# Patient Record
Sex: Female | Born: 1965 | Race: White | Hispanic: No | Marital: Single | Smoking: Current every day smoker
Health system: Southern US, Community
[De-identification: ages and names within clinical notes are randomized; demographics above are authoritative.]

## PROBLEM LIST (undated history)

## (undated) DIAGNOSIS — N393 Stress incontinence (female) (male): Secondary | ICD-10-CM

## (undated) DIAGNOSIS — R079 Chest pain, unspecified: Secondary | ICD-10-CM

## (undated) DIAGNOSIS — I219 Acute myocardial infarction, unspecified: Secondary | ICD-10-CM

## (undated) DIAGNOSIS — I252 Old myocardial infarction: Secondary | ICD-10-CM

## (undated) DIAGNOSIS — I10 Essential (primary) hypertension: Secondary | ICD-10-CM

## (undated) DIAGNOSIS — E785 Hyperlipidemia, unspecified: Secondary | ICD-10-CM

## (undated) DIAGNOSIS — I2510 Atherosclerotic heart disease of native coronary artery without angina pectoris: Secondary | ICD-10-CM

## (undated) DIAGNOSIS — K219 Gastro-esophageal reflux disease without esophagitis: Secondary | ICD-10-CM

## (undated) DIAGNOSIS — Z955 Presence of coronary angioplasty implant and graft: Secondary | ICD-10-CM

## (undated) DIAGNOSIS — G8929 Other chronic pain: Secondary | ICD-10-CM

## (undated) DIAGNOSIS — G4730 Sleep apnea, unspecified: Secondary | ICD-10-CM

## (undated) DIAGNOSIS — Z951 Presence of aortocoronary bypass graft: Secondary | ICD-10-CM

## (undated) DIAGNOSIS — F319 Bipolar disorder, unspecified: Secondary | ICD-10-CM

## (undated) DIAGNOSIS — Z8249 Family history of ischemic heart disease and other diseases of the circulatory system: Secondary | ICD-10-CM

## (undated) HISTORY — PX: CORONARY ARTERY BYPASS GRAFT: SHX141

## (undated) HISTORY — PX: CARDIOVASCULAR STRESS TEST: SHX262

## (undated) HISTORY — DX: Essential (primary) hypertension: I10

## (undated) HISTORY — DX: Acute myocardial infarction, unspecified: I21.9

## (undated) HISTORY — PX: CORONARY ANGIOPLASTY WITH STENT PLACEMENT: SHX49

## (undated) HISTORY — PX: CARDIAC CATHETERIZATION: SHX172

---

## 1999-07-13 ENCOUNTER — Encounter (INDEPENDENT_AMBULATORY_CARE_PROVIDER_SITE_OTHER): Payer: Self-pay

## 1999-07-13 ENCOUNTER — Ambulatory Visit (HOSPITAL_COMMUNITY): Admission: RE | Admit: 1999-07-13 | Discharge: 1999-07-13 | Payer: Self-pay | Admitting: *Deleted

## 1999-07-13 ENCOUNTER — Other Ambulatory Visit: Admission: RE | Admit: 1999-07-13 | Discharge: 1999-07-13 | Payer: Self-pay | Admitting: *Deleted

## 1999-07-13 ENCOUNTER — Encounter: Payer: Self-pay | Admitting: *Deleted

## 2000-04-06 ENCOUNTER — Ambulatory Visit (HOSPITAL_COMMUNITY): Admission: RE | Admit: 2000-04-06 | Discharge: 2000-04-06 | Payer: Self-pay | Admitting: Gastroenterology

## 2001-05-22 ENCOUNTER — Encounter: Payer: Self-pay | Admitting: Family Medicine

## 2001-05-22 ENCOUNTER — Ambulatory Visit (HOSPITAL_COMMUNITY): Admission: RE | Admit: 2001-05-22 | Discharge: 2001-05-22 | Payer: Self-pay | Admitting: Family Medicine

## 2002-01-16 ENCOUNTER — Encounter: Payer: Self-pay | Admitting: *Deleted

## 2002-01-16 ENCOUNTER — Emergency Department (HOSPITAL_COMMUNITY): Admission: EM | Admit: 2002-01-16 | Discharge: 2002-01-16 | Payer: Self-pay | Admitting: Emergency Medicine

## 2002-02-19 ENCOUNTER — Encounter: Payer: Self-pay | Admitting: Gastroenterology

## 2002-02-19 ENCOUNTER — Ambulatory Visit (HOSPITAL_COMMUNITY): Admission: RE | Admit: 2002-02-19 | Discharge: 2002-02-19 | Payer: Self-pay | Admitting: Gastroenterology

## 2002-03-28 ENCOUNTER — Encounter: Payer: Self-pay | Admitting: Surgery

## 2002-04-04 ENCOUNTER — Encounter (INDEPENDENT_AMBULATORY_CARE_PROVIDER_SITE_OTHER): Payer: Self-pay | Admitting: Specialist

## 2002-04-04 ENCOUNTER — Observation Stay (HOSPITAL_COMMUNITY): Admission: RE | Admit: 2002-04-04 | Discharge: 2002-04-05 | Payer: Self-pay | Admitting: Surgery

## 2002-04-04 ENCOUNTER — Encounter: Payer: Self-pay | Admitting: Surgery

## 2002-04-04 HISTORY — PX: LAPAROSCOPIC CHOLECYSTECTOMY: SUR755

## 2002-08-12 ENCOUNTER — Encounter (INDEPENDENT_AMBULATORY_CARE_PROVIDER_SITE_OTHER): Payer: Self-pay | Admitting: *Deleted

## 2002-08-12 ENCOUNTER — Ambulatory Visit (HOSPITAL_COMMUNITY): Admission: RE | Admit: 2002-08-12 | Discharge: 2002-08-12 | Payer: Self-pay | Admitting: Gastroenterology

## 2002-09-06 ENCOUNTER — Encounter: Payer: Self-pay | Admitting: Thoracic Surgery (Cardiothoracic Vascular Surgery)

## 2002-09-10 ENCOUNTER — Inpatient Hospital Stay (HOSPITAL_COMMUNITY)
Admission: RE | Admit: 2002-09-10 | Discharge: 2002-09-16 | Payer: Self-pay | Admitting: Thoracic Surgery (Cardiothoracic Vascular Surgery)

## 2002-09-10 ENCOUNTER — Encounter: Payer: Self-pay | Admitting: Thoracic Surgery (Cardiothoracic Vascular Surgery)

## 2002-09-11 ENCOUNTER — Encounter: Payer: Self-pay | Admitting: Thoracic Surgery (Cardiothoracic Vascular Surgery)

## 2002-09-12 ENCOUNTER — Encounter: Payer: Self-pay | Admitting: Thoracic Surgery (Cardiothoracic Vascular Surgery)

## 2002-09-13 ENCOUNTER — Encounter: Payer: Self-pay | Admitting: Thoracic Surgery (Cardiothoracic Vascular Surgery)

## 2002-10-14 ENCOUNTER — Encounter (HOSPITAL_COMMUNITY): Admission: RE | Admit: 2002-10-14 | Discharge: 2003-01-12 | Payer: Self-pay | Admitting: Cardiology

## 2002-10-21 ENCOUNTER — Encounter: Payer: Self-pay | Admitting: Thoracic Surgery (Cardiothoracic Vascular Surgery)

## 2002-10-21 ENCOUNTER — Encounter
Admission: RE | Admit: 2002-10-21 | Discharge: 2002-10-21 | Payer: Self-pay | Admitting: Thoracic Surgery (Cardiothoracic Vascular Surgery)

## 2003-01-13 ENCOUNTER — Encounter (HOSPITAL_COMMUNITY): Admission: RE | Admit: 2003-01-13 | Discharge: 2003-01-31 | Payer: Self-pay | Admitting: Cardiology

## 2003-08-14 ENCOUNTER — Other Ambulatory Visit: Admission: RE | Admit: 2003-08-14 | Discharge: 2003-08-14 | Payer: Self-pay | Admitting: Obstetrics and Gynecology

## 2003-10-07 ENCOUNTER — Encounter (INDEPENDENT_AMBULATORY_CARE_PROVIDER_SITE_OTHER): Payer: Self-pay | Admitting: *Deleted

## 2003-10-07 ENCOUNTER — Observation Stay (HOSPITAL_COMMUNITY): Admission: RE | Admit: 2003-10-07 | Discharge: 2003-10-08 | Payer: Self-pay | Admitting: Obstetrics and Gynecology

## 2003-10-07 HISTORY — PX: VAGINAL HYSTERECTOMY: SUR661

## 2003-12-24 ENCOUNTER — Inpatient Hospital Stay: Payer: Self-pay | Admitting: Cardiology

## 2003-12-24 ENCOUNTER — Other Ambulatory Visit: Payer: Self-pay

## 2004-04-30 ENCOUNTER — Ambulatory Visit (HOSPITAL_COMMUNITY): Admission: RE | Admit: 2004-04-30 | Discharge: 2004-04-30 | Payer: Self-pay | Admitting: Emergency Medicine

## 2005-05-11 ENCOUNTER — Ambulatory Visit (HOSPITAL_COMMUNITY): Admission: RE | Admit: 2005-05-11 | Discharge: 2005-05-11 | Payer: Self-pay | Admitting: Emergency Medicine

## 2005-09-14 ENCOUNTER — Emergency Department (HOSPITAL_COMMUNITY): Admission: EM | Admit: 2005-09-14 | Discharge: 2005-09-14 | Payer: Self-pay | Admitting: Emergency Medicine

## 2005-09-20 ENCOUNTER — Emergency Department (HOSPITAL_COMMUNITY): Admission: EM | Admit: 2005-09-20 | Discharge: 2005-09-20 | Payer: Self-pay | Admitting: Family Medicine

## 2006-08-31 ENCOUNTER — Ambulatory Visit: Payer: Self-pay | Admitting: Internal Medicine

## 2006-08-31 ENCOUNTER — Observation Stay (HOSPITAL_COMMUNITY): Admission: EM | Admit: 2006-08-31 | Discharge: 2006-09-01 | Payer: Self-pay | Admitting: Emergency Medicine

## 2006-11-07 ENCOUNTER — Ambulatory Visit (HOSPITAL_COMMUNITY): Admission: RE | Admit: 2006-11-07 | Discharge: 2006-11-07 | Payer: Self-pay | Admitting: Obstetrics and Gynecology

## 2007-02-05 ENCOUNTER — Encounter (INDEPENDENT_AMBULATORY_CARE_PROVIDER_SITE_OTHER): Payer: Self-pay | Admitting: Surgery

## 2007-02-05 ENCOUNTER — Ambulatory Visit (HOSPITAL_BASED_OUTPATIENT_CLINIC_OR_DEPARTMENT_OTHER): Admission: RE | Admit: 2007-02-05 | Discharge: 2007-02-05 | Payer: Self-pay | Admitting: Surgery

## 2007-02-05 HISTORY — PX: OTHER SURGICAL HISTORY: SHX169

## 2008-03-15 ENCOUNTER — Emergency Department (HOSPITAL_COMMUNITY): Admission: EM | Admit: 2008-03-15 | Discharge: 2008-03-15 | Payer: Self-pay | Admitting: Emergency Medicine

## 2008-03-19 ENCOUNTER — Ambulatory Visit: Payer: Self-pay | Admitting: Cardiology

## 2008-03-26 ENCOUNTER — Emergency Department (HOSPITAL_COMMUNITY): Admission: EM | Admit: 2008-03-26 | Discharge: 2008-03-26 | Payer: Self-pay | Admitting: Family Medicine

## 2008-06-26 IMAGING — CR DG CHEST 1V PORT
1 series · 1 of 1 positions shown · non-contrast
Comparison: none

HISTORY: Chest pain

PORTABLE CHEST ONE VIEW:
Normal heart size status post CABG.
Normal mediastinal contours and pulmonary vascularity.
Minimal bronchitic changes and bibasilar atelectasis.
Lungs otherwise clear.
Bones unremarkable.
No pneumothorax.

[view not recorded]
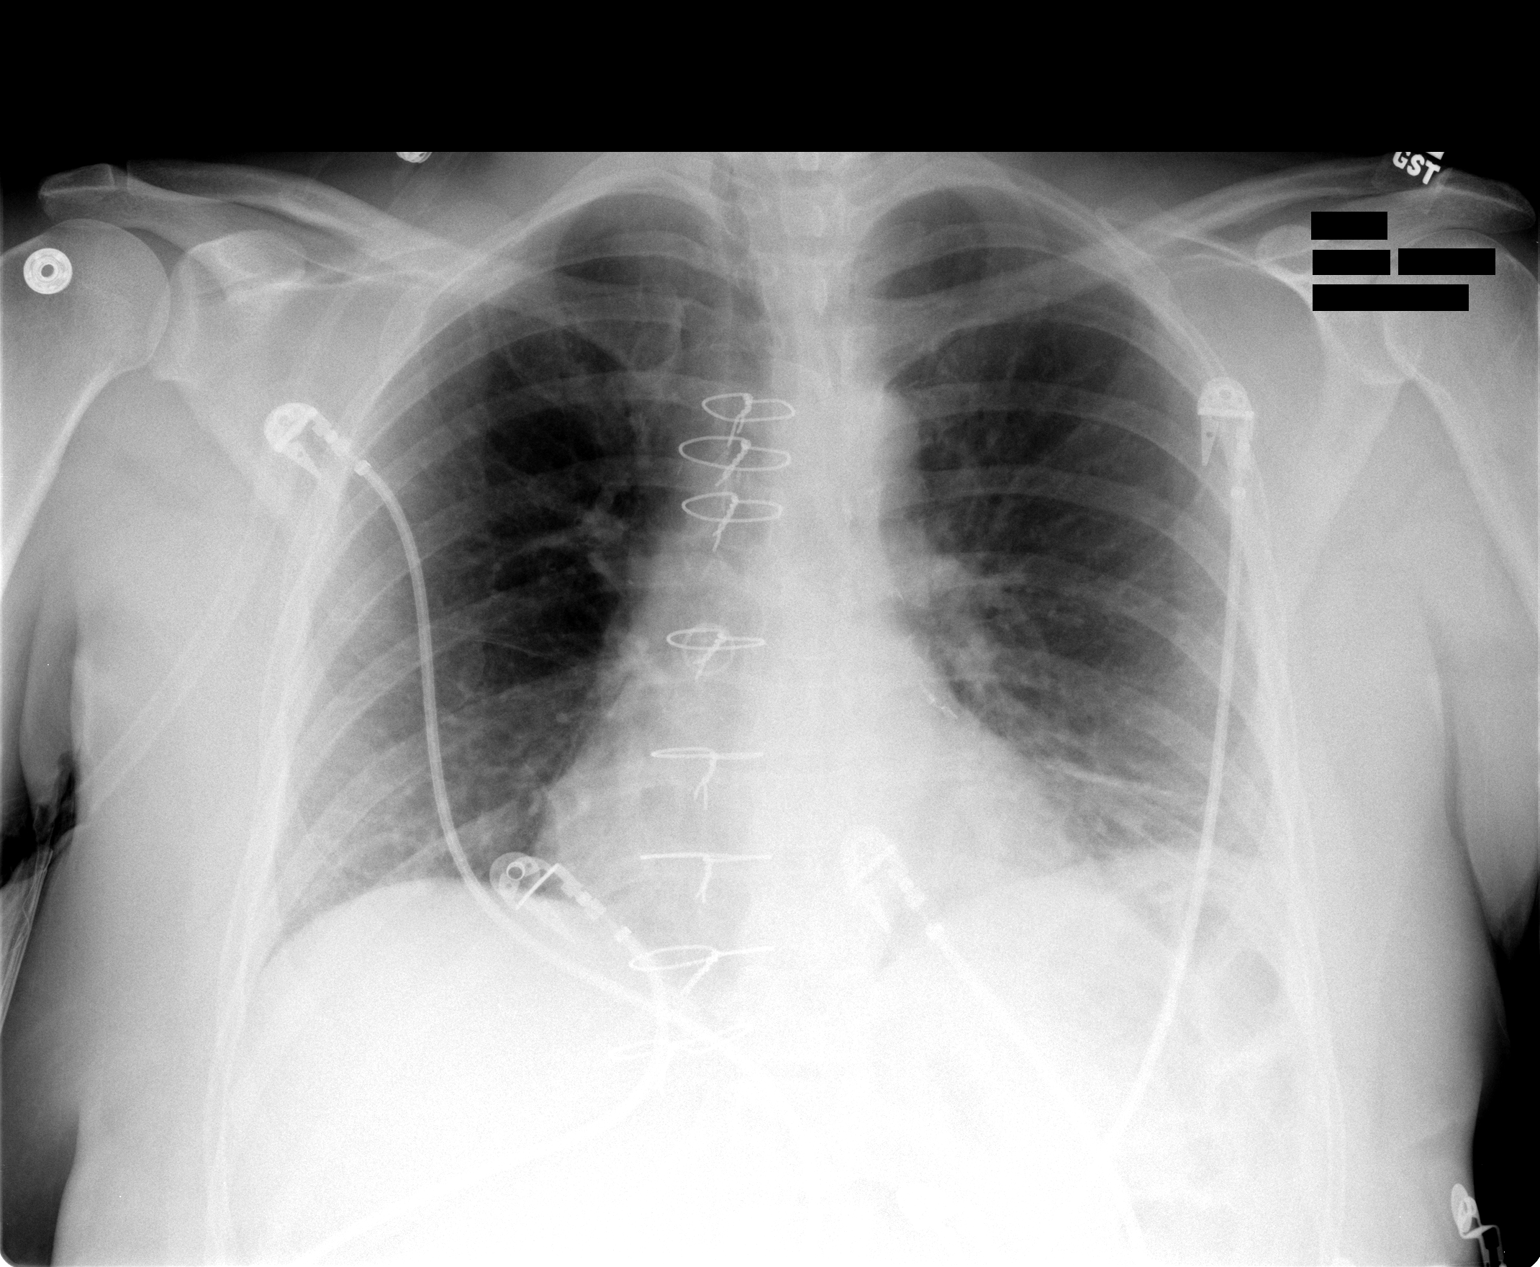

[1 of 1 positions shown; findings below may reference images not displayed]

IMPRESSION: Minimal bronchitic changes and bibasilar atelectasis.
Status post CABG.

## 2008-11-13 ENCOUNTER — Emergency Department (HOSPITAL_COMMUNITY): Admission: EM | Admit: 2008-11-13 | Discharge: 2008-11-13 | Payer: Self-pay | Admitting: Emergency Medicine

## 2008-12-23 ENCOUNTER — Emergency Department (HOSPITAL_COMMUNITY): Admission: EM | Admit: 2008-12-23 | Discharge: 2008-12-23 | Payer: Self-pay | Admitting: Emergency Medicine

## 2009-06-25 ENCOUNTER — Ambulatory Visit (HOSPITAL_BASED_OUTPATIENT_CLINIC_OR_DEPARTMENT_OTHER): Admission: RE | Admit: 2009-06-25 | Discharge: 2009-06-25 | Payer: Self-pay | Admitting: Urology

## 2009-06-25 HISTORY — PX: OTHER SURGICAL HISTORY: SHX169

## 2009-11-19 ENCOUNTER — Ambulatory Visit: Payer: Self-pay | Admitting: Cardiology

## 2010-04-29 ENCOUNTER — Encounter (HOSPITAL_COMMUNITY)
Admission: RE | Admit: 2010-04-29 | Discharge: 2010-04-29 | Disposition: A | Discharge status: Home / Self Care | Payer: Managed Care, Other (non HMO) | Source: Ambulatory Visit | Attending: Obstetrics and Gynecology | Admitting: Obstetrics and Gynecology

## 2010-04-29 DIAGNOSIS — Z01810 Encounter for preprocedural cardiovascular examination: Secondary | ICD-10-CM | POA: Insufficient documentation

## 2010-04-29 DIAGNOSIS — Z01812 Encounter for preprocedural laboratory examination: Secondary | ICD-10-CM | POA: Insufficient documentation

## 2010-04-29 LAB — BASIC METABOLIC PANEL
BUN: 11 mg/dL (ref 6–23)
CO2: 24 mEq/L (ref 19–32)
Calcium: 9.1 mg/dL (ref 8.4–10.5)
Chloride: 106 mEq/L (ref 96–112)
Creatinine, Ser: 0.89 mg/dL (ref 0.4–1.2)

## 2010-04-29 LAB — CBC
Hemoglobin: 15.2 g/dL — ABNORMAL HIGH (ref 12.0–15.0)
MCH: 31.3 pg (ref 26.0–34.0)
MCHC: 34.7 g/dL (ref 30.0–36.0)
MCV: 90.1 fL (ref 78.0–100.0)
RBC: 4.86 MIL/uL (ref 3.87–5.11)

## 2010-05-06 ENCOUNTER — Other Ambulatory Visit: Payer: Self-pay | Admitting: Obstetrics and Gynecology

## 2010-05-06 ENCOUNTER — Ambulatory Visit (HOSPITAL_COMMUNITY)
Admission: RE | Admit: 2010-05-06 | Discharge: 2010-05-06 | Disposition: A | Discharge status: Home / Self Care | Payer: Managed Care, Other (non HMO) | Source: Ambulatory Visit | Attending: Obstetrics and Gynecology | Admitting: Obstetrics and Gynecology

## 2010-05-06 DIAGNOSIS — A630 Anogenital (venereal) warts: Secondary | ICD-10-CM | POA: Insufficient documentation

## 2010-05-06 HISTORY — PX: OTHER SURGICAL HISTORY: SHX169

## 2010-05-11 NOTE — H&P (Addendum)
  [name], [name]            ACCOUNT NO.:  [id number]  MEDICAL RECORD NO.:  [id number]           PATIENT TYPE:  O  LOCATION:  SDC                           F[facility]ILITY:  [facility]  PHYSICIAN:  Janine Limbo, M.D.DATE OF BIRTH:  [birth year]  DATE OF ADMISSION:  04/29/2010 DATE OF DISCHARGE:  04/29/2010                             HISTORY & PHYSICAL   HISTORY OF PRESENT ILLNESS:  [name] is a 45 year old female, gravida 0, who presents for laser ablation of condylomata.  The patient has been followed at the [facility].  The patient notes the onset of multiple vulvar, rectal, and vaginal condylomata.  She wishes to have these removed.  The patient is status post hysterectomy dating back to 2005.  PAST MEDICAL HISTORY:  The patient has a history of severe heart disease.  She had a quadruple bypass in 2004.  She is currently doing well.  She also has a history of a cholecystectomy.  The patient has a history of hypertension and emotional problems.  CURRENT MEDICATIONS:  Fluoxetine, baby aspirin, Lipitor, Ranexa, metoprolol, and isosorbide.  DRUG ALLERGIES:  No known drug allergies.  SOCIAL HISTORY:  The patient smokes one pack of cigarettes each day. She drinks alcohol socially.  She does admit to recreational marijuana use.  REVIEW OF SYSTEMS:  Please see history of present illness.  FAMILY HISTORY:  Noncontributory.  PHYSICAL EXAMINATION:  VITAL SIGNS:  Weight is 182 pounds, height is 5 feet 5 inches. HEENT:  Within normal limits. CHEST:  Clear. HEART:  Regular rate and rhythm. BREASTS:  Without masses. ABDOMEN:  Nontender. EXTREMITIES:  Grossly normal. NEUROLOGIC:  Grossly normal. PELVIC:  External genitalia, there is a 1.5 cm condyloma present on the left lower labia.  There are multiple condyloma present on the lower vulva.  Vagina, there are multiple condyloma present.  Cervix is  absent. Uterus is absent.  Adnexa no masses are appreciated.  Rectovaginal exam multiple perirectal condylomata.  ASSESSMENT: 1. Condyloma acuminatum. 2. Severe heart disease. 3. Hypertension. 4. Depression.  PLAN:  The patient will undergo laser ablation of condyloma.  She understands the indications for her surgical procedure and she accepts the risks of, but not limited to, anesthetic complications, bleeding, infections, and possible damage to the surrounding organs.     Janine Limbo, M.D.     AVS/MEDQ  D:  05/04/2010  T:  05/05/2010  Job:  [id number]  Electronically Signed by Kirkland Hun M.D. on 05/18/2010 10:42:12 PM

## 2010-05-11 NOTE — Op Note (Signed)
[name], [name]            ACCOUNT NO.:  [id number]  MEDICAL RECORD NO.:  [id number]           PATIENT TYPE:  O  LOCATION:  [facility]                          [facility]  PHYSICIAN:  Janine Limbo, M.D.DATE OF BIRTH:  09-20-1965  DATE OF PROCEDURE:  05/06/2010 DATE OF DISCHARGE:                              OPERATIVE REPORT   PREOPERATIVE DIAGNOSIS:  Vulvar and vaginal condylomata.  POSTOPERATIVE DIAGNOSIS:  Vulvar and vaginal condylomata.  PROCEDURES: 1. Resection of vulvar condylomata. 2. Laser ablation of vulvar and vaginal condylomata.  SURGEON:  Janine Limbo, MD  FIRST ASSISTANT:  None.  ANESTHETIC:  General.  DISPOSITION:  The patient is a 45 year old female who presents with the above-mentioned diagnosis.  She understands the indications for her surgical procedure as well as the alternative treatment options.  She accepts the risks of, but not limited to, anesthetic complications, bleeding, infection, and possible damage to surrounding organs.  FINDINGS:  The patient was noted to have a 1.5 to 2 cm cluster of raised condylomata present at the introitus at approximately the 5 o'clock position.  She had multiple perivaginal and perirectal condylomata with the largest measuring approximately 3.25 cm in size.  There are multiple vaginal condylomata present.  The patient is status post hysterectomy so there was no cervix.  PROCEDURE:  The patient was taken to the operating room where a general anesthetic was given.  The patient's perineum was prepped with Betadine and acetic acid.  The large cluster of condylomata near the introitus of the vagina was then injected with 3 mL of 0.5% Marcaine with epinephrine.  The area was sharply resected.  The perineal area was sutured using 3-0 Monocryl.  Laser was then tested and was noted to be working appropriately.  We then used a defocused beam of the laser with 10 watts of energy to ablate the multiple  perineal and perirectal condylomata.  Care was taken not to penetrate too deeply.  Once all the condylomata were appropriately ablated, we then inspected the vagina carefully.  We used a defocused beam of the laser set at 20 watts to ablate the multiple vaginal condylomata.  At end of our procedure, no visible condylomata were present.  Silvadene cream was placed on all perineal lesions.  Estrace vaginal cream was placed in the vagina.  The patient was awakened from anesthetic without difficulty and transported to the recovery room in stable condition.  Needle and sponge counts were correct.  The patient tolerated her procedure well.  The estimated blood loss was less than 2 mL.  The patient was transported to the recovery room in a stable condition.  The perineal condylomata that was resected was sent to Pathology.  FOLLOWUP INSTRUCTIONS:  The patient was said to dabble warm water with "in-stent ocean" twice each day.  She would then blow dry her perineum. She will apply Silvadene cream.  She can use lidocaine gel or ointment asneeded.  She will use Estrace vaginal cream once each day for 2 weeks. She was given a prescription for Motrin 800 mg every 8 hours as needed for mild-to-moderate pain, oxycodone 5/325 one to two tablets  every 4-6 hours as needed for more severe pain, and Phenergan 25 mg every 6 hours as needed for nausea.     Janine Limbo, M.D.     AVS/MEDQ  D:  05/06/2010  T:  05/07/2010  Job:  [id number]  Electronically Signed by Kirkland Hun M.D. on 05/11/2010 11:16:32 AM

## 2010-05-18 LAB — CBC
HCT: 42.6 % (ref 36.0–46.0)
Hemoglobin: 14.7 g/dL (ref 12.0–15.0)
MCHC: 34.5 g/dL (ref 30.0–36.0)
MCV: 92.4 fL (ref 78.0–100.0)
RDW: 12.4 % (ref 11.5–15.5)

## 2010-05-18 LAB — APTT: aPTT: 51 seconds — ABNORMAL HIGH (ref 24–37)

## 2010-05-18 LAB — BASIC METABOLIC PANEL
CO2: 26 mEq/L (ref 19–32)
Chloride: 108 mEq/L (ref 96–112)
Glucose, Bld: 98 mg/dL (ref 70–99)
Potassium: 3.4 mEq/L — ABNORMAL LOW (ref 3.5–5.1)
Sodium: 138 mEq/L (ref 135–145)

## 2010-05-30 ENCOUNTER — Emergency Department (HOSPITAL_COMMUNITY)
Admission: EM | Admit: 2010-05-30 | Discharge: 2010-05-31 | Disposition: A | Discharge status: Home / Self Care | Payer: Managed Care, Other (non HMO) | Attending: Emergency Medicine | Admitting: Emergency Medicine

## 2010-05-30 DIAGNOSIS — R059 Cough, unspecified: Secondary | ICD-10-CM | POA: Insufficient documentation

## 2010-05-30 DIAGNOSIS — I2510 Atherosclerotic heart disease of native coronary artery without angina pectoris: Secondary | ICD-10-CM | POA: Insufficient documentation

## 2010-05-30 DIAGNOSIS — F17200 Nicotine dependence, unspecified, uncomplicated: Secondary | ICD-10-CM | POA: Insufficient documentation

## 2010-05-30 DIAGNOSIS — T5491XA Toxic effect of unspecified corrosive substance, accidental (unintentional), initial encounter: Secondary | ICD-10-CM | POA: Insufficient documentation

## 2010-05-30 DIAGNOSIS — R0989 Other specified symptoms and signs involving the circulatory and respiratory systems: Secondary | ICD-10-CM | POA: Insufficient documentation

## 2010-05-30 DIAGNOSIS — R0609 Other forms of dyspnea: Secondary | ICD-10-CM | POA: Insufficient documentation

## 2010-05-30 DIAGNOSIS — R0602 Shortness of breath: Secondary | ICD-10-CM | POA: Insufficient documentation

## 2010-05-30 DIAGNOSIS — R062 Wheezing: Secondary | ICD-10-CM | POA: Insufficient documentation

## 2010-05-30 DIAGNOSIS — Z79899 Other long term (current) drug therapy: Secondary | ICD-10-CM | POA: Insufficient documentation

## 2010-05-30 DIAGNOSIS — Z951 Presence of aortocoronary bypass graft: Secondary | ICD-10-CM | POA: Insufficient documentation

## 2010-05-30 DIAGNOSIS — E789 Disorder of lipoprotein metabolism, unspecified: Secondary | ICD-10-CM | POA: Insufficient documentation

## 2010-05-30 DIAGNOSIS — R05 Cough: Secondary | ICD-10-CM | POA: Insufficient documentation

## 2010-05-30 DIAGNOSIS — F319 Bipolar disorder, unspecified: Secondary | ICD-10-CM | POA: Insufficient documentation

## 2010-05-30 DIAGNOSIS — I10 Essential (primary) hypertension: Secondary | ICD-10-CM | POA: Insufficient documentation

## 2010-05-30 DIAGNOSIS — J9801 Acute bronchospasm: Secondary | ICD-10-CM | POA: Insufficient documentation

## 2010-06-03 LAB — POCT URINALYSIS DIP (DEVICE)
Bilirubin Urine: NEGATIVE
Nitrite: POSITIVE — AB
Specific Gravity, Urine: >=1.03 (ref 1.005–1.030)
pH: 5 (ref 5.0–8.0)

## 2010-06-03 LAB — URINE CULTURE

## 2010-06-14 LAB — POCT URINALYSIS DIP (DEVICE)
Ketones, ur: NEGATIVE mg/dL
Protein, ur: >=300 mg/dL — AB
Specific Gravity, Urine: 1.015 (ref 1.005–1.030)

## 2010-06-23 ENCOUNTER — Other Ambulatory Visit (HOSPITAL_COMMUNITY): Payer: Self-pay | Admitting: Obstetrics and Gynecology

## 2010-06-23 DIAGNOSIS — Z1231 Encounter for screening mammogram for malignant neoplasm of breast: Secondary | ICD-10-CM

## 2010-07-02 ENCOUNTER — Ambulatory Visit (HOSPITAL_COMMUNITY)
Admission: RE | Admit: 2010-07-02 | Discharge: 2010-07-02 | Disposition: A | Discharge status: Home / Self Care | Payer: Managed Care, Other (non HMO) | Source: Ambulatory Visit | Attending: Obstetrics and Gynecology | Admitting: Obstetrics and Gynecology

## 2010-07-02 DIAGNOSIS — Z1231 Encounter for screening mammogram for malignant neoplasm of breast: Secondary | ICD-10-CM | POA: Insufficient documentation

## 2010-07-08 ENCOUNTER — Emergency Department (HOSPITAL_COMMUNITY)
Admission: EM | Admit: 2010-07-08 | Discharge: 2010-07-09 | Disposition: A | Discharge status: Home / Self Care | Payer: Managed Care, Other (non HMO) | Attending: Emergency Medicine | Admitting: Emergency Medicine

## 2010-07-08 DIAGNOSIS — R599 Enlarged lymph nodes, unspecified: Secondary | ICD-10-CM | POA: Insufficient documentation

## 2010-07-08 DIAGNOSIS — J020 Streptococcal pharyngitis: Secondary | ICD-10-CM | POA: Insufficient documentation

## 2010-07-08 DIAGNOSIS — I10 Essential (primary) hypertension: Secondary | ICD-10-CM | POA: Insufficient documentation

## 2010-07-08 DIAGNOSIS — I25810 Atherosclerosis of coronary artery bypass graft(s) without angina pectoris: Secondary | ICD-10-CM | POA: Insufficient documentation

## 2010-07-09 LAB — RAPID STREP SCREEN (MED CTR MEBANE ONLY): Streptococcus, Group A Screen (Direct): POSITIVE — AB

## 2010-07-13 NOTE — Cardiovascular Report (Signed)
[name], [name]            ACCOUNT NO.:  [id number]   MEDICAL RECORD NO.:  [id number]          PATIENT TYPE:  INP   LOCATION:  3703                         FACILITY:  [facility]   PHYSICIAN:  Rollene Rotunda, MD, FACCDATE OF BIRTH:  [birth year]   DATE OF PROCEDURE:  08/31/2006  DATE OF DISCHARGE:                            CARDIAC CATHETERIZATION   PRIMARY CARE PHYSICIAN:  Dr. Georgina Pillion.   CARDIOLOGIST:  Dr. Gretta Cool.   PROCEDURE:  Left heart catheterization/coronary arteriography.   INDICATIONS:  Evaluate a patient with unstable angina.  She is status  post CABG in 2004 by Dr. Tressie Stalker.  She subsequently had PCI of a  vein graft to her right coronary artery with two CYPHER stents in  October 2005.   PROCEDURE NOTE:  Left heart catheterization was performed via the right  femoral artery.  The artery was cannulated using anterior wall puncture.  A #6-French arterial sheath was inserted via the modified Seldinger  technique.  Preformed Judkins and a pigtail catheter were utilized.  The  patient tolerated the procedure well and left the lab in stable  condition.   RESULTS:  Hemodynamics: LV 162/31, AO 161/90.  Coronaries:  The left  main was normal.  The LAD was occluded proximally.  It was a large  vessel that wrapped the apex.  It was perfused via a LIMA graft.  There  was a 25% anastomotic lesion.  The first diagonal was small with ostial  25% stenosis.  The circumflex was occluded proximally.  A first obtuse  marginal was small and normal.  The second obtuse marginal was large and  branching.  It was occluded at the ostium and seen to fill via the vein  graft.  The right coronary artery is occluded proximally.  It was seen  to fill slightly with  predominantly LAD to right coronary collaterals.  Grafts:  A LIMA to the LAD was patent with a 25% anastomotic lesion as  described.  An intact RIMA to the obtuse marginal was widely patent.  Saphenous vein grafts to the  right coronary artery was occluded  proximally in previous stent.   LEFT VENTRICULOGRAM:  The left ventriculogram was obtained in the RAO  projection.  The EF was approximately 55% with mild inferior  hypokinesis.   CONCLUSION:  1. Severe native three-vessel coronary artery disease.  2. Occluded vein graft to the right coronary artery.  3. Slightly reduced ejection fraction with regional wall motion      abnormality.   PLAN:  I have reviewed the films with Dr. Riley Kill.  We reviewed the  clinical situation.  Though I do believe the pain is angina she has not  ruled in for myocardial infarction.  At this point we think the prudent  step is continued medical management.  Toward that end she needs to stop  smoking.  I have added Imdur 30 mg.  She will remain on beta blockers.  I will up titrate her statins slightly by changing from simvastatin 40  to Lipitor 40. Her LDL was 130 this admission.  Her HDL 36.   The patient will return  to Dr. Ladona Mow for follow-up and continued  aggressive risk reduction.  If she had continued symptoms consideration  could be given to percutaneous revascularization.      Rollene Rotunda, MD, [facility]  Electronically Signed     JH/MEDQ  D:  09/01/2006  T:  09/01/2006  Job:  [id number]   cc:   Oley Balm. Georgina Pillion, M.D.  Myna Hidalgo, MD

## 2010-07-13 NOTE — H&P (Signed)
[name], [name]            ACCOUNT NO.:  [id number]   MEDICAL RECORD NO.:  [id number]          PATIENT TYPE:  INP   LOCATION:  1831                         FACILITY:  [facility]   PHYSICIAN:  Bevelyn Buckles. Bensimhon, MDDATE OF BIRTH:  1965/08/05   DATE OF ADMISSION:  08/30/2006  DATE OF DISCHARGE:                              HISTORY & PHYSICAL   PRIMARY CARE PHYSICIAN:  Onalee Hua B. Georgina Pillion, M.D.   CARDIOLOGIST:  Was formerly Pierce Crane, MD, in [place], now will  be [facility].   REASON FOR ADMISSION:  Unstable angina.   HISTORY OF PRESENT ILLNESS:  [name] is a very pleasant 45-year-  old woman with a history of hypertension, hyperlipidemia,  bipolar  disorder, and ongoing tobacco use.  She also has a history of premature  coronary artery disease.  She underwent bypass surgery in July 2004 with  Dr. Tressie Stalker.  This was done with a LIMA to the LAD, saphenous vein  graft to the OM, and a sequential saphenous vein graft to the PDA and  posterolateral.  She underwent repeat catheterization in October 2005  for recurrent chest pain.  This was done in [place] and showed severe  native three-vessel disease with total occlusion of all three native  arteries.  The LIMA to the LAD was widely patent.  The saphenous vein  graft to the OM was okay.  There was significant disease in both arms of  the sequential saphenous vein graft to the PDA and posterolateral, and  she received two Cypher drug-eluting stents.  She has done well since  that time.  She did undergo a stress test in January of this year which  she tells me was negative.   Unfortunately, since Monday she has had stuttering chest pain, happening  about 3-4 times a day.  She describes it as central chest pressure  radiating back to her shoulder blades and down her left arm.  This is  exactly the same presentation as before her bypass surgery.  She has had  it both at rest and with exertion, and it has been  increasing in  frequency over the past few days.  She has been taking nitroglycerin  with relief.  She had recurrent chest pain tonight, and her partner  brought her to the emergency room.  She received nitroglycerin and is  now pain free.  EKG was not acute.   REVIEW OF SYSTEMS:  She denies any fevers, chills, nausea, vomiting,  claudication. No focal neurologic signs.  No bleeding, melena, bright  red blood per rectum.  She does have some arthritis pain.  She does have  bipolar disorder but has been well controlled on medications.  The  remainder of the Review of Systems is negative except for HPI and  Problem List.   CURRENT MEDICATIONS:  1. Atenolol 50 mg a day.  2. Prozac 80 mg a day.  3. Zocor 40 mg a day.  4. Aspirin 325 mg a day.  5. Trazodone 100 mg a day.  6. Wellbutrin XL 300 mg a day.   ALLERGIES:  No known drug allergies.  SOCIAL HISTORY:  She lives in [place] with her partner.  She has a  history of heavy tobacco use,  now smoking one pack per day which she  has done for at least 32 years.  She denies any drug use.  She does have  occasional alcohol.  She works as a Research scientist (medical).   FAMILY HISTORY:  There is an extensive family history of coronary artery  disease on her mother's side.  Her mother is alive at 6 and has a  history of coronary artery disease and myocardial infarction in her 80s.  Her father is alive and well at 9.  She has two sisters who are alive  and well.  On brother died of cancer at age 79.   PHYSICAL EXAMINATION:  GENERAL:  She is well appearing, in no acute  distress.  VITAL SIGNS:  She is afebrile.  She is sitting in bed.  Respirations are  unlabored.  Blood pressure 111/64, heart rate 61, saturating 100% on 2  liters.  HEENT:  Normal.  NECK:  Supple.  There is no JVD. Carotids are 2+ bilaterally without  bruits.  There is no lymphadenopathy or thyromegaly.  CARDIAC:  PMI is nondisplaced.  She has a regular rate and rhythm.  There are  no murmurs, rubs, or gallops appreciated.  LUNGS:  Clear to auscultation.  There are no wheezes or rales.  ABDOMEN:  Soft, nontender, nondistended.  There is no  hepatosplenomegaly, no bruits, no masses appreciated.  Good bowel  sounds.  EXTREMITIES:  Warm with no cyanosis, clubbing, or edema.  DP pulses are  2+ bilaterally.  SKIN:  There is no rash.  She does have multiple tattoos.  NEUROLOGIC:  She is alert and oriented x3.  Cranial nerves II-XII  intact.  Moves all four extremities without difficulty.  Affect is very  pleasant.   EKG shows sinus rhythm with no acute ST-T wave changes.   Point-of-care labs showed sodium 139, potassium 3.8, chloride 105, BUN  13, glucose 103, creatinine 0.8.  INR 1.0.  Cardiac markers are pending.   ASSESSMENT:  1. Chest pain concerning for unstable angina.  2. Premature coronary artery disease status post bypass surgery in      July 2004 and Cypher drug-eluting stent x2 to the saphenous vein      graft to the right coronary system in October 2005.  3. Tobacco use ongoing.  4. Bipolar disorder, well controlled.  5. Hypertension.  6. Hyperlipidemia.   PLAN AND DISCUSSION:  Her chest pain is very concerning for unstable  angina.  She is currently pain free.  We will admit her, treat her with  heparin, beta blocker, aspirin, and nitroglycerin.  She will need  cardiac catheterization tomorrow.  We will order a smoking cessation  consultation.     Bevelyn Buckles. Bensimhon, MD  Electronically Signed    DRB/MEDQ  D:  08/31/2006  T:  08/31/2006  Job:  [id number]

## 2010-07-13 NOTE — Discharge Summary (Signed)
[name], [name]            ACCOUNT NO.:  [id number]   MEDICAL RECORD NO.:  [id number]          PATIENT TYPE:  INP   LOCATION:  3703                         FACILITY:  [facility]   PHYSICIAN:  Rollene Rotunda, MD, FACCDATE OF BIRTH:  01/31/1966   DATE OF ADMISSION:  08/30/2006  DATE OF DISCHARGE:  09/01/2006                               DISCHARGE SUMMARY   DISCHARGING DIAGNOSES:  1. Chest pain, status post cardiac catheterization this admission with      patent grafts, left internal mammary artery to the left anterior      descending artery, right internal mammary artery to the circumflex      is patent.  The patient has severe native three-vessel disease with      occluded saphenous vein graft to the right coronary artery with      plans for medical management.  2. Hyperlipidemia.  Will change generic simvastatin to Lipitor 40 mg      daily.  3. Ongoing tobacco abuse with failed Chantix therapy.  Will continue      Wellbutrin.   Past medical history includes:  1. Coronary disease status post CABG.      a.     Cypher drug-eluting stent x2 to saphenous vein graft to the       right coronary artery system in October 2005.  2. Ongoing tobacco use.  3. Bipolar disorder.  4. Hypertension.  5. Hyperlipidemia.   Procedures this admission include cardiac catheterization on August 31, 2006, results as stated above.[name]   HOSPITAL COURSE:  [name] is a pleasant 45 year old female with  history as stated above, status post bypass in July 2004 with a LIMA to  the LAD, saphenous vein graft to the OM, sequential saphenous vein graft  to the PDA and posterolateral.  Repeat catheterization in October 2005  for recurrent chest pain.  At that time the patient's catheterization  showed severe native three-vessel disease with total occlusion of all  three native arteries.  The LIMA to the LAD was widely patent and the  saphenous vein graft to the OM was okay.  Significant disease in both  the saphenous vein graft to the PDA and posterolateral, and she received  2 Cypher drug-eluting stents at that time.  The patient had done well  until this admission, when she abruptly began having stuttering chest  discomfort.  She was brought to the emergency room.  EKG without acute  ST or T-wave changes.  The patient stated compliance with medications.  Cardiac markers without indication of myocardial infarction.  The  patient was taken to the catheterization lab.  Results as stated above.  The patient tolerated procedure without complications.  The patient is  being discharged home with instructions to follow up with Dr. Darrold Junker,  her primary cardiologist, first name Trinna Post, primary care physician, Dr.  Lajean Manes, as previously scheduled.  The patient will need to see Dr.  Darrold Junker within the next 2 weeks for a post catheterization check.  She  has been given the post cardiac catheterization discharge instructions.   Medications at time of discharge include:  1.  Metoprolol succinate 50 mg daily.  Her atenolol has been      discontinued.  2. Lipitor 40 mg daily.  Simvastatin has been discontinued.  3. Imdur 30 mg daily.  This is a new medication.  4. She is to continue her aspirin 325 mg.  5. Trazodone 100 mg or as previously prescribed.  6. Wellbutrin 300 mg daily or as previously prescribed.  7. Fluoxetine 80 mg daily or as previously prescribed.  8. She has been given a prescription for nitroglycerin sublingual      p.r.n.   She has been given the post cardiac catheterization discharge  instructions.  At time of discharge, CBC:  H&H was 13.2 and 38.3.  Hemoglobin A1c 5.6.  Total cholesterol 185, triglycerides 96, LDL 130,  HDL 36.  Potassium 3.7, BUN and creatinine 13 at 0.76, AST 22, ALT 16.   DURATION OF DISCHARGE ENCOUNTER:  35 minutes.      Dorian Pod, ACNP      Rollene Rotunda, MD, [facility]  Electronically Signed    MB/MEDQ  D:  09/01/2006  T:  09/01/2006   Job:  [id number]   cc:   Oley Balm. Georgina Pillion, M.D.  Jamse Mead, MD

## 2010-07-13 NOTE — Op Note (Signed)
[name], [name]            ACCOUNT NO.:  [id number]   MEDICAL RECORD NO.:  [id number]          PATIENT TYPE:  AMB   LOCATION:  DSC                          FACILITY:  [facility]   PHYSICIAN:  Ardeth Sportsman, MD     DATE OF BIRTH:  1965-04-08   DATE OF PROCEDURE:  02/05/2007  DATE OF DISCHARGE:                               OPERATIVE REPORT   PRIMARY CARE PHYSICIAN:  Oley Balm. Georgina Pillion, M.D.   SURGEON:  Ardeth Sportsman, M.D.   PREOPERATIVE DIAGNOSIS:  Left arm mass.   POSTOPERATIVE DIAGNOSIS:  Left posterior mid-arm fibrotic dermal mass.   PROCEDURE PERFORMED:  Excision of left arm mass.   ANESTHESIA:  Local anesthetic (1% lidocaine mixed equally with 0.25%  bupivacaine).   SPECIMEN:  Left arm mass.   DRAINS:  None.   ESTIMATED BLOOD LOSS:  Less than 2 mL.   COMPLICATIONS:  None apparent.   INDICATIONS:  Ms. Severino is a 45 year old female who has had a left  arm mass that has been bothering her intermittently, but it has  increased in tenderness and discomfort.  She requests excision.  Differential diagnoses were discussed.  Options were discussed, and  recommendation was made for excision.  Risks such as bleeding, wound  infection, hematoma, seroma, recurrence of mass, or reaction to local  anesthetic and other risks were discussed.  She agreed to proceed.   OPERATIVE FINDINGS:  She had about a 7 x 10 mm fibrotic dermal mass  including the skin in the middle of one of her tattoos.  It did not seem  to be cystic, but more solid.   DESCRIPTION OF PROCEDURE:  Informed consent was confirmed.  The  patient's posterior arm was prepped and draped in a sterile fashion.  Field block of local anesthetic was placed.  We initially made a 15 mm  vertical incision, hidden within the tattoo itself in an oblique fashion  on the arm.  However, on getting to the area, it became apparent that it  was fibrotic and not a cystic nodule.  Therefore, I had to do a  bilenticular incision  to actually take part of the skin off, as I could  not free it off without taking the skin off.  There was one little  corner of persistent fibrositic material, and I removed that, and I took  a wedge of skin with that as well.  The resulting wound was closed using  4-0 Monocryl deep dermal interrupted stitches and Steri-Strips.  A  sterile dressing was applied.  She tolerated the procedure well.   Postoperative instructions were given, and she will call later this week  for the pathology results.  I think she can return to clinic in a couple  weeks, sooner p.r.n., if she improves markedly.  She expressed  understanding and appreciation.      Ardeth Sportsman, MD  Electronically Signed     SCG/MEDQ  D:  02/05/2007  T:  02/05/2007  Job:  [id number]

## 2010-07-16 NOTE — Op Note (Signed)
[name], [name]                      ACCOUNT NO.:  [id number]   MEDICAL RECORD NO.:  [id number]                   PATIENT TYPE:  INP   LOCATION:  2316                                 FACILITY:  [facility]   PHYSICIAN:  Salvatore Decent. Cornelius Moras, M.D.              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   POSTOPERATIVE DIAGNOSIS:  Three-vessel coronary artery disease with class 4  angina.   PROCEDURE:  Median sternotomy for coronary artery bypass grafting x4 with  endoscopic vein harvest (left internal mammary artery to distal left  anterior descending coronary artery, right internal mammary artery to  circumflex marginal branch, saphenous vein graft to posterior descending  coronary artery and  sequential saphenous vein graft to the right  posterolateral branch).   SURGEON:  Salvatore Decent. Cornelius Moras, M.D.   FIRST ASSISTANT:  Evelene Croon, M.D.   SECOND ASSISTANT:  Coral Ceo, P.A.   ANESTHESIA:  General.   INDICATIONS FOR PROCEDURE:  The patient is a 45 year old female with a  history of  hypertension, hyperlipidemia, tobacco abuse and a strong family  history of coronary artery disease.  The patient presents with a  longstanding history of recurrent symptoms of exertional chest pain  suspicious for angina. A cardiac catheterization performed by Dr. Gae Bon demonstrates severe  3-vessel coronary artery disease with  preserved left ventricular function. A full consultation  note has been  dictated previously. The patient has been counseled at length regarding the  indications, risks and potential benefits of surgery. All of her questions  have been addressed. She desires to proceed as described.   DESCRIPTION OF PROCEDURE:  The patient was brought to the operating room on  the above mentioned date and  central monitor was established by the  anesthesia service under the care and direction of Dr. Hart Robinsons.  Subsequently a Swann-Ganz catheter was placed through the right internal  jugular approach. A radial arterial line is placed. Intravenous antibiotics  were administered. Following  induction with general endotracheal  anesthesia, a Foley catheter is placed. The patient's chest, abdomen, both  groins and both lower extremities were prepped and draped in a sterile  manner.   A median sternotomy incision is performed and the left internal mammary  artery was dissected from the chest wall and prepared for bypass grafting.  The left internal mammary artery was a good quality conduit. Following  this  the right internal mammary artery was also dissected from the chest wall and  prepared for bypass grafting. This vessel was a good quality conduit. The  patient was heparinized systemically. Simultaneously saphenous vein is  obtained from the patient's right using endoscopic vein harvest technique.  The saphenous vein is a good quality conduit.   The pericardium  is opened. The ascending aorta is normal in appearance. The  ascending aorta and the right atrium are cannulated for a cardiopulmonary  bypass. Adequate heparinization is verified. Cardiopulmonary bypass is begun  and the surface of the heart  is inspected. The distal sites are selected  for coronary artery bypass grafting. Both internal mammary artery grafts are  trimmed to appropriate lengths as is the saphenous vein conduit. A  temperature probe is placed in the left ventricular septum. A cardioplegia  catheter is placed in the ascending aorta.   The patient is cooled to 32 degrees systemic temperature. The aortic cross  clamp is applied and cardioplegia is delivered in an antegrade fashion  through the aortic root. Iced saline slush is applied for topic hypothermia.  The initial cardioplegia graft and myocardial cooling  are felt to be  excellent. Repeat doses of cardioplegia are administered intermittently  throughout the cross clamp portion of the operation, both through the aortic  root and down  the subsequently placed vein graft to maintain septal  temperature  below 15 degrees centigrade.   The following distal  coronary anastomoses are performed:  1. The posterior descending coronary artery off the distal right coronary     artery is grafted with a saphenous vein graft in a side-to-side fashion.     This coronary measures 1.5-mm in diameter at the site of the distal     bypass and is of good quality.  2. The posterolateral branch off the distal right coronary artery is grafted     using a  sequential saphenous vein graft off of the vein and placed in     the posterior descending coronary artery. This coronary measures 1.6-mm     in diameter and is of good quality.  3. The circumflex marginal branch is grafted with the right internal mammary     artery in an end-to-side fashion. The right internal mammary is utilized     in-situ with the pedicle placed posterior to the aorta through the     transverse sinus. The circumflex marginal branch measures 1.8 mm in     diameter at the site of the distal  bypass. It is of fair quality with     some diffuse plaque throughout this vessel.  4. The distal left anterior descending coronary artery is grafted with the     left internal mammary artery in an end-to-side fashion. This coronary     artery is endomyocardial. It measures 2.0 mm at the site of the distal     bypass and is of fair quality. There is plaque diffusely in this vessel,     but a 2.0 probe will easily pass well down the anterior wall of the heart     to the distal vessel. The single proximal saphenous vein anastomosis is     performed directly to the ascending aorta prior to removal of the aortic     cross clamp.  The septal temperature  is noted to rise rapidly and dramatically upon   reperfusion of the internal mammary artery grafts. The aortic cross clamp is  removed after total cross clamp  time of 77 minutes.   The heart begins to beat spontaneously without the need for cardioversion.  All proximal and distal anastomoses are inspected for hemostasis and  appropriate graft  orientation. There is some bleeding noted in the vicinity  of the distal anastomosis of the left internal mammary artery graft  to the  left anterior  descending coronary artery. Because this is deep in the  intramyocardial portion of the left ventricle, correction of the bleeding is  felt to be risky without a still operative field. For this reason a 2nd  cardioplegia catheter is placed in the ascending aorta. Rewarming is  stopped.   The aortic cross clamp  is reapplied and an additional dose of cardioplegia  is administered. The septal temperature cooling is verified with the septal  temperature probe and both internal mammary artery grafts are briefly  occluded. After excellent diastolic cardiac arrest and myocardial cooling is  verified, the distal anastomosis and the surrounding  myocardium is  inspected. There appears to be a small arterial branch in the myocardium in  the trough utilized for intramyocardial exposure of the left anterior  descending coronary artery which is the source of the blood loss. This is  controlled with 7-0 Prolene sutures.   The left internal mammary artery graft  is reopened and hemostasis is  verified. The septal temperature again rises rapidly and dramatically. The  right internal mammary artery graft  is also reopened. The aortic cross  clamp  is removed after a total additional cross clamp  time of 15 minutes,  making a complete cross clamp  time for the operation 92 minutes.   The heart is defibrillated and normal sinus rhythm resumes spontaneously.  All proximal  and distal  anastomoses are inspected for hemostasis and  appropriate graft  orientation.   Epicardial pacing wires were affixed to the right ventricular outflow tract  into the right atrial appendage. The patient is rewarmed to the 37 degree  centigrade temperature. The patient is weaned from cardiopulmonary bypass  without difficulty. The patient's rhythm at separation from bypass is normal  sinus rhythm. No inotropic support is required. Total cardiopulmonary bypass  time for the operation is 117 minutes.   The venous and arterial cannulae are removed uneventfully. Protamine is  administered to reverse the anticoagulation. The mediastinum and both  pleural spaces are irrigated with saline solution containing vancomycin.  Meticulous surgical hemostasis is ascertained. The mediastinum and both  pleural spaces are drained with 4 chest tubes placed through separate stab  incisions inferiorly. The median sternotomy is closed in a routine fashion. The right lower extremity incision is closed in multiple layers in a routine  fashion. All skin incisions are closed with subcuticular skin closures.   The patient tolerated the procedure well and is transported to the surgical  intensive care unit in stable condition. There are no interoperative  complications. All sponge, instrument and needle counts are verified correct  at the completion of the operation. No blood products were administered.                                               Salvatore Decent. Cornelius Moras, M.D.    CHO/MEDQ  D:  09/10/2002  T:  09/10/2002  Job:  [id number]   cc:   Marcina Millard, M.D.   Henrene Hawking, M.D.   Anselmo Rod, M.D.  [address]  [address]  [place]  [place] [address]  Fax: [phone]

## 2010-07-16 NOTE — Op Note (Signed)
NAMEJASPREET, Nichols                      ACCOUNT NO.:  [id number]   MEDICAL RECORD NO.:  [id number]                   PATIENT TYPE:  OBV   LOCATION:  9399                                 [facility]ILITY[facility]  [facility]   PHYSICIAN:  Janine Limbo, M.D.            DATE OF BIRTH:  [birth year]   DATE OF PROCEDURE:  10/07/2003  DATE OF DISCHARGE:                                 OPERATIVE REPORT   PREOPERATIVE DIAGNOSES:  Severe dysmenorrhea.   POSTOPERATIVE DIAGNOSES:  1. Severe dysmenorrhea.  2. Fibroid uterus.   PROCEDURE:  Vaginal hysterectomy.   SURGEON:  Janine Limbo, M.D.   FIRST ASSISTANT:  Osborn Coho, M.D.   ANESTHESIA:  General.   DISPOSITION:  [name] is a 45 year old female who presents with severe  dysmenorrhea.  Her discomfort has not responded to hormonal therapy nor  nonsteroidal antiinflammatory agents.  She is ready to proceed with  definitive therapy. She understands the indications for her procedure as  well as the alternative treatment options. She accepts the risks of, but not  limited to, anesthetic complications, bleeding, infections, and possible  damage to the surrounding organs.   FINDINGS:  Overall, the uterus was normal size. There were two fibroids  present on the uterus measuring approximately 1 cm in size each.  The  fallopian tubes were normal bilaterally. The left ovary was normal. There  was a 3 cm hemorrhagic corpus luteum on the right ovary. This caused  bleeding during the procedure. Bleeding was made hemostatic using figure-of-  eight sutures.   DESCRIPTION OF PROCEDURE:  The patient was taken to the operating room where  a general anesthetic was given. The patient's abdomen, perineum and vagina  were prepped with multiple layers of Betadine. A Foley catheter was placed  in the bladder. The patient was sterilely draped. Examination under  anesthesia was performed. A weighted speculum was placed in the vagina and  the cervix was  injected with a dilute solution of Pitressin and saline.  A  circumferential incision was made around the cervix and the vaginal mucosa  was advanced both anteriorly and posteriorly.  The anterior cul-de-sac was  sharply entered. The posterior cul-de-sac was then sharply entered.  Alternating from left to right, the uterosacral ligaments, paracervical  tissues, parametrial tissues, and the uterine arteries were clamped, cut,  sutured and tied securely. The uterus was inverted through the posterior  colpotomy.  The upper pedicles were then secured and the uterus was  transected from the operative field. The upper pedicles were then free tied  and then suture ligated.  The patient was noted to have a hemorrhagic corpus  luteum on her right ovary.  This hemorrhagic corpus luteum ruptured during  the course of the surgery and bleeding was noted.  Hemostasis was achieved  using several figure-of-eight sutures of #0 Vicryl.  The sutures attached to  the uterosacral ligaments were brought out through the vaginal angles and  then tied securely. A McCall culdoplasty suture was placed in the posterior  cul-de-sac incorporating the uterosacral ligaments bilaterally and the  posterior peritoneum.  A final check was made for hemostasis and hemostasis  was noted to be adequate. The vaginal cuff was then closed using figure-of-  eight sutures incorporating the anterior vaginal mucosa, the anterior  peritoneum, the posterior peritoneum, and then the posterior vaginal mucosa.  The McCall culdoplasty suture was tied and the apex of the vagina was noted  to elevate into the mid pelvis. Sponge, needle and instrument counts were  correct on two occasions. The estimated blood loss was 100 mL.  The  estimated urine output was 250 mL.  The urine was noted to be clear. The  patient tolerated the procedure well.  She was awakened from her anesthetic  without difficulty and taken to the recovery room in stable  condition. 2-0  Vicryl was the suture material used throughout the procedure except where  otherwise mentioned.                                               Janine Limbo, M.D.    AVS/MEDQ  D:  10/07/2003  T:  10/07/2003  Job:  [id number]   cc:   Oley Balm. Georgina Pillion, M.D.  [address]  [place]  [place] [address]  Fax: [phone]

## 2010-07-16 NOTE — H&P (Signed)
[name], [name]                      ACCOUNT NO.:  [id number]   MEDICAL RECORD NO.:  [id number]                   PATIENT TYPE:  AMB   LOCATION:  SDC                                  FACILITY:  [facility]   PHYSICIAN:  Janine Limbo, M.D.            DATE OF BIRTH:  Jul 13, 1965   DATE OF ADMISSION:  DATE OF DISCHARGE:                                HISTORY & PHYSICAL   DATE OF ADMISSION:  October 07, 2003   HISTORY OF PRESENT ILLNESS:  [name] is a 45 year old female gravida 0  who presents for a vaginal hysterectomy.  The patient has been followed at  the [facility] and Gynecology division of [facility].  The patient complains of increasing dysmenorrhea.  Her discomfort has not been relived with nonsteroidal antiinflammatory  agents.  The patient has a history of severe heart disease and she also is a  cigarette smoker.  Oral contraceptive use is not an option.  Her discomfort  has not responded to Depo-Provera therapy.  She has considered all of her  options and she wishes to proceed with definitive therapy at this time.  Her  most recent Pap smear was within normal limits.  She has had an endometrial  biopsy in the past that was negative.  The patient denies an past history of  sexually-transmitted infections.   PAST MEDICAL HISTORY:  The patient has a history of severe heart disease.  She has had quadruple bypass (September 10, 2002).  She is currently doing well.  She also has a history of a cholecystectomy dating to February 2004.  The  patient has a history of hypertension.  She has a history of emotional  problems in the past.   CURRENT MEDICATIONS:  1. Cymbalta 90 mg each day.  2. Neurontin 100 mg each day.  3. Atenolol 50 mg each day.  4. Zocor 20 mg each day.  5. Trazodone 100 mg each day.  6. Aspirin 325 mg each day.   DRUG ALLERGIES:  None known.   SOCIAL HISTORY:  The patient smokes one-and-a-half packs of cigarettes  each  day.  She drinks alcohol socially.  She denies recreational drug use.   REVIEW OF SYSTEMS:  See history of present illness.   FAMILY HISTORY:  Noncontributory.   PHYSICAL EXAMINATION:  VITAL SIGNS:  Weight is 167 pounds.  HEENT:  Within normal limits.  CHEST:  Clear.  HEART:  Regular rate and rhythm.  BREASTS:  Without masses.  ABDOMEN:  Soft and nontender.  EXTREMITIES:  Within normal limits.  NEUROLOGIC:  Grossly normal.  PELVIC:  External genitalia is normal.  The vagina is normal.  The cervix is  nontender and no lesions are present.  The uterus is normal size and shape.  The uterus is only slightly tender.  No adnexal masses are appreciated.  Rectovaginal exam confirms.   ASSESSMENT:  1. Dysmenorrhea that has not responded  to Depo-Provera therapy, nonsteroidal     antiinflammatory agents, and narcotics.  2. History of severe heart disease and now status post quadruple bypass.  3. Hypertension.   PLAN:  The patient will undergo a vaginal hysterectomy.  She understands the  indications for her surgical procedure and she accepts the risks of, but not  limited to, anesthetic complications, bleeding, infections, and possible  damage to the surrounding organs.  The patient understands the alternative  therapies which include medical therapy, uterine artery embolization,  endometrial ablation, and possible Mirena IUD use.  She was told of the  risks and benefits of each of those options.  She has elected to proceed  with definitive therapy.                                               Janine Limbo, M.D.    AVS/MEDQ  D:  09/26/2003  T:  09/26/2003  Job:  [id number]   cc:   Oley Balm. Georgina Pillion, M.D.  [address]  [place]  [place] [address]  Fax: [phone]

## 2010-07-16 NOTE — Procedures (Signed)
[place]  Patient:    [name], [name]                     MRN: [id number] Proc. Date: 04/06/00 Adm. Date:  [id number] Disc. Date: [id number] Attending:  Charna Elizabeth CC:         Joycelyn Rua, M.D.   Procedure Report  DATE OF BIRTH:  1965-10-31.  REFERRING PHYSICIAN:  Joycelyn Rua, M.D.  PROCEDURE PERFORMED:  Colonoscopy.  ENDOSCOPIST:  Anselmo Rod, M.D.  INSTRUMENT USED:  Olympus video colonoscope.  INDICATIONS FOR PROCEDURE:  Rectal bleeding in a 45 year old white female rule out colonic polyps, masses, hemorrhoids, etc.  PREPROCEDURE PREPARATION:  Informed consent was procured from the patient. The patient was fasted for eight hours prior to the procedure and prepped with a bottle of magnesium citrate and a gallon of NuLytely the night prior to the procedure.  PREPROCEDURE PHYSICAL:  The patient had stable vital signs.  Neck supple. Chest clear to auscultation.  S1, S2 regular.  Abdomen soft with normal abdominal bowel sounds.  DESCRIPTION OF PROCEDURE:  The patient was placed in the left lateral decubitus position and sedated with 70 mg of Demerol and 7 mg of Versed intravenously.  Once the patient was adequately sedated and maintained on low-flow oxygen and continuous cardiac monitoring, the Olympus video colonoscope was advanced from the rectum to the cecum with extreme difficulty secondary to a very redundant colon.  The patient had what appeared to be bleeding internal hemorrhoids with some erythematous mottling in the rectum. There was a large amount of residual stool throughout the colon.  No large masses or polyps were seen.  IMPRESSION:  Bleeding internal hemorrhoid.  RECOMMENDATIONS: 1. Anusol suppositories have been prescribed for the patient,  2.5% one p.r.    q.h.s. #30 have been prescribed. 2. Outpatient follow-up is advised in the next four weeks.DD:  04/10/00 TD:  04/11/00 Job: [id number]

## 2010-07-16 NOTE — Discharge Summary (Signed)
[name], [name]                      ACCOUNT NO.:  [id number]   MEDICAL RECORD NO.:  [id number]                   PATIENT TYPE:  INP   LOCATION:  2024                                 FACILITY:  [facility]   PHYSICIAN:  Salvatore Decent. Cornelius Moras, M.D.              DATE OF BIRTH:  1965/08/03   DATE OF ADMISSION:  09/10/2002  DATE OF DISCHARGE:  09/16/2002                                 DISCHARGE SUMMARY   ADMISSION DIAGNOSIS:  Severe three vessel coronary artery disease with class  IV angina.   DISCHARGE DIAGNOSIS:  Severe two vessel coronary artery disease with class  IV angina, status post coronary artery bypass grafting.   PAST MEDICAL HISTORY:  1. Hypertension.  2. Hyperlipidemia.  3. Bipolar disorder.  4. Depression.  5. GERD.  Upper GI endoscopy by Anselmo Rod, M.D.  Diagnosis of small     ulcerations distal esophagus and stomach.  6. Longstanding tobacco use.   PAST SURGICAL HISTORY:  Cholecystectomy February 2004.   ALLERGIES:  No known drug allergies.   HISTORY OF PRESENT ILLNESS:  [name] is a 45 year old Caucasian  female.  The summer of 2003, she started to suffer episodes of exertional  chest pain.  He originally presented to the emergency room at [facility]. [facility], no specific etiology for chest pain was discovered.  Later in the fall, she underwent cardiac work-up which included a stress  echocardiogram  but did not show any ischemic wall motion abnormalities.  However, she did have a known ST segment depression during this test.  She  was felt at that time to be a low risk for significant underlying coronary  artery disease.  Following this, she underwent full GI work-up under the  care of Dr. Anselmo Rod.  She had been treated for GERD, also underwent  cholecystectomy.  She continued to have chest pain.  She eventually  presented to Dr. Darrold Junker for second opinion to rule out coronary disease.  She underwent elective cardiac  catheterization August 30, 2002, at [facility].  Cardiac catheterization revealed severe three  vessel coronary artery disease including 100% chronic occlusion of the  proximal left circumflex, 100% chronic occlusion of proximal right coronary  artery and long segment 50 to 60% stenosis of the mid LAD coronary artery.  The LV ejection fraction estimated to be 45%.  She was referred to [facility] for  consideration of coronary bypass and was seen in the office by Dr. Tressie Stalker on September 05, 2002.   After examination of the patient and review of the available records,  including review of the catheterization films, Dr. Cornelius Moras recommended  proceeding with coronary artery bypass grafting  as the preferred treatment  choice for [name].  The procedure risks and benefits were discussed  with the patient and she agreed to proceed with surgery.  He was scheduled  for elective admission  on Tuesday, September 10, 2002.   HOSPITAL COURSE:  On September 10, 2002, [name] was electively admitted to  [facility] under the care of Dr. Tressie Stalker.  She  underwent the following surgical procedure.  Coronary artery bypass grafting  x4.  Grafts placed at the time of the procedure were left internal mammary  artery grafted to left anterior descending coronary artery, right internal  mammary artery grafted to the circumflex marginal artery, saphenous vein was  grafted in sequential fashion to the posterior descending coronary artery  and right posterolateral artery.  A vein was harvested from her right thigh  via the endovein harvesting technique.  She tolerated this procedure well,  transferring in stable condition to the SICU.  She remained hemodynamically  stable in the immediate postoperative period and was extubated several hours  after arrival in the ICU and awoke from anesthesia neurologically intact.   [name] postoperative course has been essentially  uneventful.  Course has been slowed slightly by her pulmonary status.  She actually  required O2 supplementation as well as bronchodilators to maintain her O2  saturations.  This is most likely related to her smoking history.  She has  been weaned off the oxygen and by this morning, September 15, 2002, postoperative  day #4, she is 92% saturation on room air.  She has also had some  postoperative anemia with hemoglobin 7.5 and hematocrit 22.6 on  postoperative day #3.  She was transfused with two units of packed cells.  By the morning of postoperative day #4, hemoglobin was 9.7 and hematocrit  28.   [name] was seen by the smoking cessation counselor.  The patient  reports that she quit smoking two weeks prior to admission.  She will be  followed up in the outpatient setting via telephone calls.   [name] is making good progress on recovering from her surgery.  This  morning, September 15, 2002, postoperative day #4, her vital signs are stable  with blood pressure 130/58. She is afebrile.  Room air saturation 92%.  Her  heart is in sinus rhythm, slightly tachy at 101.  Her lungs are clear.  Her  bowel and bladder functions are within normal limits for her.  Her incisions  are all healing well.  She has no lower extremity edema.  She is walking in  the hallway with minimal assistance.  Her pain control is adequate.  Ms.  Nichols beta-blocker will be increased today to Lopressor 25 mg b.i.d.  If she continues to progress in this manner, it is anticipated she will be  ready for discharge home tomorrow, September 16, 2002.   LABORATORY DATA:  September 14, 2002, CBC with wbc 10.8, hemoglobin 9.7,  hematocrit 28.8, platelets 305.  Chemistries September 14, 2002, includes sodium  137, potassium 3.5, BUN 10, creatinine 0.6, glucose 122.   CONDITION ON DISCHARGE:  Improved.   MEDICATIONS:  1. Aspirin 325 mg p.o. daily. 2. Lopressor 25 mg p.o. b.i.d.  3. She was instructed to resume the following  home medications:     a. Neurontin 100 mg daily.     b. Effexor 225 mg daily.     c. Trazodone 100 mg q.h.s.     d. Protonix 40 mg daily.     e. Flonase p.r.n.  4. For pain management, she may have Tylox one to two p.o. q.4-6h. for     moderate to severe pain or Tylenol 325 mg one to  two p.o. q.4-6h. for     mild pain.   ACTIVITY:  She has been asked to refrain from any driving or any heavy  lifting, pushing or pulling.  She has also been instructed to instructed to  continue her breathing exercises and daily walking.   DIET:  This should be a heart healthy diet.   WOUND CARE:  She should shower daily with mild soap and water.  If her  incisions show any incisions show any signs of infection or if she develops  a fever greater than 101 degrees F, she will call Dr. Orvan July office.    FOLLOW UP:  1. She should see Dr. Darrold Junker in his office approximately two weeks.  She     has been asked to call to arrange that appointment.  2. Dr. Cornelius Moras would like to see her at the [facility] office in approximately three     to four weeks.  The office will call for date and time for that     appointment.  She will be asked to have a chest x-ray at [facility] one hour     prior to that appointment.     Toribio Harbour, N.P.                  Salvatore Decent. Cornelius Moras, M.D.    CTK/MEDQ  D:  09/15/2002  T:  09/16/2002  Job:  [id number]   cc:   Dr. Atha Starks, [place]   Oley Balm. Georgina Pillion, M.D.  [address]  [place]  [place] [address]  Fax: [phone]    cc:   Dr. Atha Starks, [place]   Oley Balm. Georgina Pillion, M.D.  [address]  [place]  [place] [address]  Fax: [phone]

## 2010-07-16 NOTE — Op Note (Signed)
NAME:  [name], [name]                      ACCOUNT NO.:  [id number]   MEDICAL RECORD NO.:  [id number]                   PATIENT TYPE:  AMB   LOCATION:  ENDO                                 FACILITY:  [facility]   PHYSICIAN:  Anselmo Rod, M.D.               DATE OF BIRTH:  23-Feb-1966   DATE OF PROCEDURE:  08/12/2002  DATE OF DISCHARGE:                                 OPERATIVE REPORT   PROCEDURE:  Esophagogastroduodenoscopy with biopsies from the antrum and  from the esophagus.   ENDOSCOPIST:  Anselmo Rod, M.D.   INSTRUMENT USED:  Olympus video panendoscope.   INDICATIONS FOR PROCEDURE:  A 45 year old white female with a history of  chest pain not responding to PPI's or Cox II inhibitors (Bextra). The  patient gives a history of chest discomfort with pushing, pulling, or  bending over.  She has taken PPI's on a regular basis as well, but denies  any frank reflux symptoms.   PREPROCEDURE PHYSICAL EXAMINATION:  VITAL SIGNS: Stable.  NECK: Supple.  CHEST: Clear to auscultation. S1 and S2 regular.  ABDOMEN: Soft with normal bowel sounds.   DESCRIPTION OF PROCEDURE:  The patient was placed in the left lateral  decubitus position and sedated with 100 mg of Demerol and 9 mg of Versed  intravenously. Once the patient was adequately sedated and maintained on low  flow oxygen and continuous cardiac monitoring, the Olympus video  panendoscope was advanced through the mouthpiece, over the tongue, and into  the esophagus under direct vision. A small patch of Barrett's-type mucosa  was biopsied above the Z-line to confirm the diagnosis.  There was evidence  of antral gastritis.  Biopsies were done from the antrum to rule out  presence of H.pylori by pathology.  Retroflexion in the high cardiac  revealed no abnormalities.  The mid body and the proximal stomach appeared  normal.  The duodenal bulb and the small bowel distal to the bulb up to 60  cm appeared normal as well.  The  patient tolerated the procedure well  without complications.   IMPRESSION:  1. Small patch of salmon pink mucosa above Z-line biopsied to rule out     Barrett's.  2. Antral gastritis biopsies done to rule out H.pylori.  3. Normal proximal small bowel.   RECOMMENDATIONS:  1. Treat with antibiotics if H.pylori present.  2. Continue PPI's for now.  3. Trial of Vioxx to see if symptoms improve.  4.     Follow up with Dr. Anabel Bene for further recommendations with regards to her     chest pain if the above interventions do not help her symptoms.  5. Outpatient follow-up in the next two weeks for further recommendations.  Anselmo Rod, M.D.    JNM/MEDQ  D:  08/12/2002  T:  08/12/2002  Job:  [id number]   cc:   Oley Balm. Georgina Pillion, M.D.  [address]  [place]  [place] [address]  Fax: [phone]

## 2010-07-16 NOTE — Op Note (Signed)
NAMEKENZY, CAMPOVERDE                      ACCOUNT NO.:  [id number]   MEDICAL RECORD NO.:  [id number]                   PATIENT TYPE:  AMB   LOCATION:  DAY                                  FACILITY:  [facility]   PHYSICIAN:  Abigail Miyamoto, M.D.              DATE OF BIRTH:  [birth year]   DATE OF PROCEDURE:  04/04/2002  DATE OF DISCHARGE:                                 OPERATIVE REPORT   PREOPERATIVE DIAGNOSIS:  Biliary dyskinesia.   POSTOPERATIVE DIAGNOSIS:  Biliary dyskinesia.   PROCEDURE:  Laparoscopic cholecystectomy.   SURGEON:  Abigail Miyamoto, M.D.   ASSISTANT:  Anselm Pancoast. Zachery Dakins, M.D.   ANESTHESIA:  General endotracheal anesthesia.   ESTIMATED BLOOD LOSS:  Minimal.   FINDINGS:  The patient was found to have a normal cholangiogram.   DESCRIPTION OF PROCEDURE:  The patient was brought to the operating room and  identified as [name]. She was placed supine on the operating table  and general anesthesia was induced. Her abdomen was then prepped and draped  in the usual sterile fashion.   Using a #15 blade a small transverse incision was made below the umbilicus.  The incision was carried down through the fascia which was then opened with  a scalpel. A __________ was used to pass through the peritoneal cavity. Next  a 0 Vicryl pursestring suture was placed over the fascial opening. The  Hasson port was placed through the opening and was placed and the abdomen  __________.  Next an 11-mm port was placed in the patient's epigastrium and  two 5-mm ports were placed at the base of the right flank under direct  visualization.   The gallbladder was then grasped and retracted up off of the liver bed.  Dissection was then carried out of the base of the gallbladder. The cystic  duct was easily dissected out and clipped once  distally.   An angiocatheter was inserted in the right upper quadrant under direct  visualization. The cholangiocatheter was placed through  the angiocatheter  and placed into the cystic duct once it was opened with the scissors. A  cholangiogram was then performed under direct fluoroscopy.   Good flow of contrast was seen into the entire biliary system and duodenum.  No evidence of abnormalities were identified. At this point the  cholangiocatheter was removed.   The cystic duct was then clipped 3 times proximally and completely  transected. The cystic artery was identified and clipped twice proximally,  once distally and transected as well. The gallbladder was then easily  dissected free from the liver bed with the electrocautery. Hemostasis was  achieved in the liver bed with the cautery. Once the gallbladder was  released from the liver bed, it was removed through the incision of the  umbilicus.   The 0 over the umbilicus broke as we were tying it down, so another 0 Vicryl  figure-of-8 suture was placed closing the fascial  defect. Hemostasis then  again was examined and was felt to be achieved. All ports were removed under  direct visualization and the abdomen was deflated. All incisions were  anesthetized with 0.25% Marcaine and closed with 4-0 Monocryl in  subcuticular sutures. Steri-Strips, gauze and tape were applied.   The patient tolerated the procedure well. All sponge, needle and instrument  counts were correct at the end of the procedure. The patient was  then  extubated in the operating room and taken in stable condition to the  recovery room.                                                Abigail Miyamoto, M.D.    DB/MEDQ  D:  04/04/2002  T:  04/04/2002  Job:  [id number]   cc:   Anselmo Rod, M.D.  [address]  [address]  [place]  [place] [address]  Fax: [phone]

## 2010-07-16 NOTE — Discharge Summary (Signed)
NAMEALBANY, WINSLOW                      ACCOUNT NO.:  [id number]   MEDICAL RECORD NO.:  [id number]                   PATIENT TYPE:  OBV   LOCATION:  9302                                 FACILITY:  [facility]   PHYSICIAN:  Janine Limbo, M.D.            DATE OF BIRTH:  09-Apr-1965   DATE OF ADMISSION:  10/07/2003  DATE OF DISCHARGE:                                 DISCHARGE SUMMARY   DISCHARGE DIAGNOSES:  1. Severe dysmenorrhea.  2. Fibroid uterus.   PROCEDURES THIS ADMISSION:  October 07, 2003 - vaginal hysterectomy.   HISTORY OF PRESENT ILLNESS:  Ms. Mooty is a 45 year old female gravida 0  who presents with dysmenorrhea that is unresponsive to nonsteroidal  antiinflammatory agents, pain medications, and hormonal therapy.  She is  known to have fibroids.  Please see her dictated History and Physical Exam.   ADMISSION PHYSICAL EXAMINATION:  The patient is afebrile and her vital signs  are stable.  The uterus is normal size, shape, and consistency.   HOSPITAL COURSE:  On the day of admission the patient underwent a vaginal  hysterectomy.  She tolerated her procedure well and no complications were  noted.  The patient remained afebrile throughout her hospital stay.  On the  morning of October 08, 2003 the patient was tolerating a regular diet.  Her  pain was well controlled.  Her postoperative hemoglobin was 12.3  (preoperative hemoglobin 14.7).  The patient was felt to be ready for  discharge.   DISCHARGE MEDICATIONS:  1. Percocet (5/325) one or two tablets q.4h. as needed for pain.  2. Toradol 10 mg q.6h. p.r.n. pain.  3. The patient will continue her preoperative medications.   DISCHARGE INSTRUCTIONS:  The patient will return to see Dr. Stefano Gaul in 6  weeks for a follow-up examination.  She will refrain from driving for 2  weeks, heavy lifting for 4 weeks, and vaginal penetration for 6 weeks.  She  will call with questions or concerns.  She will call for a temperature  greater than 100.4 or for severe problems.  The  patient was given an instruction sheet as prepared by [facility] Gynecology for patients who have undergone major surgery.   FINAL PATHOLOGY:  Pending.                                               Janine Limbo, M.D.    AVS/MEDQ  D:  10/08/2003  T:  10/08/2003  Job:  [id number]   cc:   Oley Balm. Georgina Pillion, M.D.  [address]  [place]  [place] [address]  Fax: [phone]

## 2010-12-14 LAB — COMPREHENSIVE METABOLIC PANEL
Albumin: 3.8
Alkaline Phosphatase: 67
BUN: 13
CO2: 25
Chloride: 104
Creatinine, Ser: 0.76
GFR calc non Af Amer: >60
Glucose, Bld: 97
Potassium: 3.7
Total Bilirubin: 0.5

## 2010-12-14 LAB — CBC
HCT: 38.3
HCT: 42.2
Hemoglobin: 13.2
Hemoglobin: 13.7
Hemoglobin: 14.4
MCHC: 34
MCHC: 34.3
MCHC: 34.4
MCV: 90.6
MCV: 91.3
Platelets: 193
Platelets: 204
RBC: 4.43
RDW: 12.4
RDW: 12.5
WBC: 8.6

## 2010-12-14 LAB — I-STAT 8, (EC8 V) (CONVERTED LAB)
Acid-base deficit: 1
BUN: 13
Bicarbonate: 24.2 — ABNORMAL HIGH
HCT: 45
Hemoglobin: 15.3 — ABNORMAL HIGH
Operator id: 277751
Sodium: 139
TCO2: 25
pCO2, Ven: 42.6 — ABNORMAL LOW

## 2010-12-14 LAB — TROPONIN I: Troponin I: 0.01

## 2010-12-14 LAB — CARDIAC PANEL(CRET KIN+CKTOT+MB+TROPI)
CK, MB: 1.1
Total CK: 71
Troponin I: 0.03
Troponin I: 0.04

## 2010-12-14 LAB — LIPID PANEL
Cholesterol: 185
HDL: 36 — ABNORMAL LOW
Total CHOL/HDL Ratio: 5.1

## 2010-12-14 LAB — CK TOTAL AND CKMB (NOT AT ARMC): CK, MB: 1.2

## 2010-12-14 LAB — HEPARIN LEVEL (UNFRACTIONATED): Heparin Unfractionated: 0.62

## 2010-12-14 LAB — PROTIME-INR: INR: 1

## 2011-05-26 ENCOUNTER — Ambulatory Visit: Payer: Self-pay | Admitting: Cardiology

## 2011-06-27 ENCOUNTER — Ambulatory Visit (INDEPENDENT_AMBULATORY_CARE_PROVIDER_SITE_OTHER): Payer: Managed Care, Other (non HMO) | Admitting: Physician Assistant

## 2011-06-27 VITALS — BP 138/85 | HR 77 | Temp 98.7°F | Resp 16 | Ht 65.0 in | Wt 185.0 lb

## 2011-06-27 DIAGNOSIS — J40 Bronchitis, not specified as acute or chronic: Secondary | ICD-10-CM

## 2011-06-27 DIAGNOSIS — R05 Cough: Secondary | ICD-10-CM

## 2011-06-27 LAB — POCT CBC
Granulocyte percent: 60 %G (ref 37–80)
HCT, POC: 43.7 % (ref 37.7–47.9)
Hemoglobin: 14.8 g/dL (ref 12.2–16.2)
MCHC: 33.9 g/dL (ref 31.8–35.4)
MPV: 9.2 fL (ref 0–99.8)
POC Granulocyte: 7.7 — AB (ref 2–6.9)
POC MID %: 6.6 %M (ref 0–12)
RBC: 4.79 M/uL (ref 4.04–5.48)

## 2011-06-27 MED ORDER — HYDROCODONE-HOMATROPINE 5-1.5 MG/5ML PO SYRP: ORAL_SOLUTION | ORAL | Status: AC

## 2011-06-27 MED ORDER — LEVOFLOXACIN 500 MG PO TABS: 500.0000 mg | ORAL_TABLET | Freq: Every day | ORAL | Status: AC

## 2011-06-27 MED ORDER — PREDNISONE 20 MG PO TABS: ORAL_TABLET | ORAL | Status: AC

## 2011-06-27 NOTE — Progress Notes (Signed)
Patient ID: [name] MRN: [id number], DOB: 1965-07-31, 46 y.o. Date of Encounter: 06/27/2011, 8:04 PM  Primary Physician: No primary provider on file.  Chief Complaint:  Chief Complaint  Patient presents with  . Cough    since last Monday  . Nasal Congestion    chest congestion     HPI: 46 y.o. year old female presents with a 8 day history of chest congestion and cough. No sinus pressure. Afebrile. No chills. Cough is non-productive and not associated with time of day. No SOB, wheezing, palpitations, or edema. Normal hearing. Has tried OTC cold preps without success. No GI complaints. Appetite normal. "Feels just like my bronchitis last time."  No sick contacts, recent antibiotics, or recent travels.   No leg trauma, sedentary periods, or h/o cancer.  No past medical history on file.   Home Meds: Prior to Admission medications   Medication Sig Start Date End Date Taking? Authorizing Provider  aspirin 325 MG tablet Take 325 mg by mouth daily.   Yes Historical Provider, MD  atorvastatin (LIPITOR) 40 MG tablet Take 40 mg by mouth daily.   Yes Historical Provider, MD  isosorbide mononitrate (IMDUR) 60 MG 24 hr tablet Take 60 mg by mouth daily.   Yes Historical Provider, MD  metoprolol succinate (TOPROL-XL) 50 MG 24 hr tablet Take 50 mg by mouth daily. Take with or immediately following a meal.   Yes Historical Provider, MD  ranolazine (RANEXA) 500 MG 12 hr tablet Take 500 mg by mouth 2 (two) times daily.   Yes Historical Provider, MD                         Allergies: No Known Allergies  History   Social History  . Marital Status: Single    Spouse Name: N/A    Number of Children: N/A  . Years of Education: N/A   Occupational History  . Not on file.   Social History Main Topics  . Smoking status: Current Everyday Smoker  . Smokeless tobacco: Not on file  . Alcohol Use: Not on file  . Drug Use: Not on file  . Sexually Active: Not on file   Other Topics  Concern  . Not on file   Social History Narrative  . No narrative on file     Review of Systems: Constitutional: negative for chills, fever, night sweats or weight changes Cardiovascular: negative for chest pain or palpitations Respiratory: negative for hemoptysis, wheezing, or shortness of breath Abdominal: negative for abdominal pain, nausea, vomiting or diarrhea Dermatological: negative for rash Neurologic: negative for headache   Physical Exam: Blood pressure 138/85, pulse 77, temperature 98.7 F (37.1 C), temperature source Oral, resp. rate 16, height 5\' 5"  (1.651 m), weight 185 lb (83.915 kg), SpO2 97.00%., Body mass index is 30.79 kg/(m^2). General: Well developed, well nourished, in no acute distress. Head: Normocephalic, atraumatic, eyes without discharge, sclera non-icteric, nares are congested. Bilateral auditory canals clear, TM's are without perforation, pearly grey with reflective cone of light bilaterally. No sinus TTP. Oral cavity moist, dentition normal. Posterior pharynx with post nasal drip and mild erythema. No peritonsillar abscess or tonsillar exudate. Neck: Supple. No thyromegaly. Full ROM. No lymphadenopathy. Lungs: Coarse breath sounds bilaterally without wheezes, rales, or rhonchi. Breathing is unlabored.  Heart: RRR with S1 S2. No murmurs, rubs, or gallops appreciated. Msk:  Strength and tone normal for age. Extremities: No clubbing or cyanosis. No edema. Neuro: Alert and oriented  X 3. Moves all extremities spontaneously. CNII-XII grossly in tact. Psych:  Responds to questions appropriately with a normal affect.   Labs: Results for orders placed in visit on 06/27/11  POCT CBC      Component Value Range   WBC 12.9 (*) 4.6 - 10.2 (K/uL)   Lymph, poc 4.3 (*) 0.6 - 3.4    POC LYMPH PERCENT 33.4  10 - 50 (%L)   MID (cbc) 0.9  0 - 0.9    POC MID % 6.6  0 - 12 (%M)   POC Granulocyte 7.7 (*) 2 - 6.9    Granulocyte percent 60.0  37 - 80 (%G)   RBC 4.79  4.04  - 5.48 (M/uL)   Hemoglobin 14.8  12.2 - 16.2 (g/dL)   HCT, POC 78.2  95.6 - 47.9 (%)   MCV 91.3  80 - 97 (fL)   MCH, POC 30.9  27 - 31.2 (pg)   MCHC 33.9  31.8 - 35.4 (g/dL)   RDW, POC 21.3     Platelet Count, POC 284  142 - 424 (K/uL)   MPV 9.2  0 - 99.8 (fL)     ASSESSMENT AND PLAN:  46 y.o. year old female with bronchitis. -Levaquin 500 mg #10 1 po daily no RF -Hycodan #4oz 1 tsp po q 4-6 hours prn cough no RF SED, May call for a refill -Prednisone 20 mg #18 3x3, 2x3, 1x3 no RF -Mucinex -Tylenol/Motrin prn -Rest/fluids -RTC precautions -RTC 3-5 days if no improvement  Elinor Dodge, PA-C 06/27/2011 8:04 PM

## 2012-01-04 ENCOUNTER — Ambulatory Visit (INDEPENDENT_AMBULATORY_CARE_PROVIDER_SITE_OTHER): Payer: Managed Care, Other (non HMO) | Admitting: Physician Assistant

## 2012-01-04 VITALS — BP 123/84 | HR 88 | Temp 97.4°F | Resp 18 | Wt 178.0 lb

## 2012-01-04 DIAGNOSIS — R05 Cough: Secondary | ICD-10-CM

## 2012-01-04 DIAGNOSIS — J40 Bronchitis, not specified as acute or chronic: Secondary | ICD-10-CM

## 2012-01-04 DIAGNOSIS — R059 Cough, unspecified: Secondary | ICD-10-CM

## 2012-01-04 DIAGNOSIS — J9801 Acute bronchospasm: Secondary | ICD-10-CM

## 2012-01-04 MED ORDER — IPRATROPIUM BROMIDE 0.06 % NA SOLN: 2.0000 | Freq: Three times a day (TID) | NASAL | Status: DC

## 2012-01-04 MED ORDER — LEVOFLOXACIN 500 MG PO TABS: 500.0000 mg | ORAL_TABLET | Freq: Every day | ORAL | Status: DC

## 2012-01-04 MED ORDER — HYDROCODONE-HOMATROPINE 5-1.5 MG/5ML PO SYRP: ORAL_SOLUTION | ORAL | Status: DC

## 2012-01-04 MED ORDER — ALBUTEROL SULFATE HFA 108 (90 BASE) MCG/ACT IN AERS: 2.0000 | INHALATION_SPRAY | RESPIRATORY_TRACT | Status: DC | PRN

## 2012-01-04 NOTE — Progress Notes (Signed)
Patient ID: [name] MRN: [id number], DOB: 1966-02-13, 46 y.o. Date of Encounter: 01/04/2012, 12:32 PM  Primary Physician: Sheila Oats, MD  Chief Complaint:  Chief Complaint  Patient presents with  . URI    HPI: 46 y.o. year old female presents with a seven day history of nasal congestion, post nasal drip, and cough. Mild sinus pressure. Afebrile. No chills. Nasal congestion thick and green/yellow. Cough is productive of green/yellow sputum and not associated with time of day. Some wheezing. Ears feel full, leading to sensation of muffled hearing. Has tried OTC cold preps without success. No GI complaints. Appetite decreased today. Patient with history of bronchitis. Patient's girlfriend's grandson sick with similar symptoms.   No recent antibiotics, or recent travels.   No leg trauma, sedentary periods, or h/o cancer.  Past Medical History  Diagnosis Date  . Depression   . Myocardial infarction      Home Meds: Prior to Admission medications   Medication Sig Start Date End Date Taking? Authorizing Provider  aspirin 325 MG tablet Take 325 mg by mouth daily.   Yes Historical Provider, MD  atorvastatin (LIPITOR) 40 MG tablet Take 40 mg by mouth daily.   Yes Historical Provider, MD  isosorbide mononitrate (IMDUR) 60 MG 24 hr tablet Take 60 mg by mouth daily.   Yes Historical Provider, MD  metoprolol succinate (TOPROL-XL) 50 MG 24 hr tablet Take 50 mg by mouth daily. Take with or immediately following a meal.   Yes Historical Provider, MD                              ranolazine (RANEXA) 500 MG 12 hr tablet Take 500 mg by mouth 2 (two) times daily.    Historical Provider, MD    Allergies: No Known Allergies  History   Social History  . Marital Status: Single    Spouse Name: N/A    Number of Children: N/A  . Years of Education: N/A   Occupational History  . Not on file.   Social History Main Topics  . Smoking status: Current Every Day Smoker -- 1.0  packs/day for 37 years    Types: Cigarettes  . Smokeless tobacco: Not on file  . Alcohol Use: 0.0 oz/week    1-2 Cans of beer per week  . Drug Use: No  . Sexually Active: Not on file   Other Topics Concern  . Not on file   Social History Narrative  . No narrative on file     Review of Systems: Constitutional: negative for chills, fever, night sweats or weight changes Cardiovascular: negative for chest pain or palpitations Respiratory: negative for hemoptysis, dyspnea, or orthopnea  Abdominal: negative for abdominal pain, nausea, vomiting or diarrhea Dermatological: negative for rash Neurologic: negative for headache   Physical Exam: Blood pressure 123/84, pulse 88, temperature 97.4 F (36.3 C), temperature source Oral, resp. rate 18, weight 178 lb (80.74 kg)., There is no height on file to calculate BMI. General: Well developed, well nourished, in no acute distress. Head: Normocephalic, atraumatic, eyes without discharge, sclera non-icteric, nares are congested. Bilateral auditory canals clear, TM's are without perforation, pearly grey with reflective cone of light bilaterally. No sinus TTP. Oral cavity moist, dentition normal. Posterior pharynx with post nasal drip and mild erythema. No peritonsillar abscess or tonsillar exudate. Neck: Supple. No thyromegaly. Full ROM. No lymphadenopathy. Lungs: Coarse breath sounds bilaterally without wheezes, rales, or rhonchi. Breathing is unlabored.  Heart: RRR with S1 S2. No murmurs, rubs, or gallops appreciated. Msk:  Strength and tone normal for age. Extremities: No clubbing or cyanosis. No edema. Neuro: Alert and oriented X 3. Moves all extremities spontaneously. CNII-XII grossly in tact. Psych:  Responds to questions appropriately with a normal affect.     ASSESSMENT AND PLAN:  46 y.o. year old female with bronchitis, bronchospasm, and cough. -Levaquin 500 mg 1 po daily #10 no RF -Hycodan #4oz 1 tsp po q 4-6 hours prn cough no RF  SED -Atrovent NS 0.06% 2 sprays each nare bid prn #1 no RF -Proventil 2 puffs inhaled q 4-6 hours prn #1 RF 2 -Mucinex -Tylenol/Motrin prn -Rest/fluids -RTC precautions -RTC 3-5 days if no improvement  Signed, Eula Listen, PA-C 01/04/2012 12:32 PM

## 2012-01-04 NOTE — Patient Instructions (Addendum)
Bronchospasm, Adult  Take your antibiotic once daily. Use your inhaler every 4-6 hours as needed for cough/wheezing. For the first couple of days you may need this about every 4-6 hours, then your need for it should begin to taper off. Take your cough syrup every 4-6 hours as needed for cough. It may may you a little sleepy.  Bronchospasm means that there is a spasm or tightening of the airways going into the lungs. Because the airways go into a spasm and get smaller it makes breathing more difficult. For reasons not completely known, workings (functions) of the airways designed to protect the lungs become over active. This causes the airways to become more sensitive to:  Infection.  Weather.  Exercise.  Irritants.  Things that cause allergic reactions or allergies (allergens). Frequent coughing or respiratory episodes should be checked for the cause. This condition may be made worse by exercise. CAUSES  Inflammation is often the cause of this condition. Allergy, viral respiratory infections, or irritants in the air often cause this problem. Allergic reactions produce immediate and delayed responses. Late reactions may produce more serious inflammation. This may lead to increased reactivity of the airways. Sometimes this is inherited. Some common triggers are:  Allergies.  Infection commonly triggers attacks. Antibiotics are not helpful for viral infections and usually do not help with attacks of bronchospasm.  Exercise (running, etc.) can trigger an attack. Proper pre-exercise medications help most individuals participate in sports. Swimming is the least likely sport to cause problems.  Irritants (for example, pollution, cigarette smoke, strong odors, aerosol sprays, paint fumes, etc.) may trigger attacks. You cannot smoke and do not allow smoking in your home. This is absolutely necessary. Show this instruction to mates, relatives and significant others that may not agree with  you.  Weather changes may cause lung problems but moving around trying to find an ideal climate does not seem to be overly helpful. Winds increase molds and pollens in the air. Rain refreshes the air by washing irritants out. Cold air may cause irritation.  Emotional problems do not cause lung problems but can trigger attacks. SYMPTOMS  Wheezing is the most common symptom. Frequent coughing (with or without exercise and or crying) and repeated respiratory infections are all early warning signs of bronchospasm. Chest tightness and shortness of breath are other symptoms. DIAGNOSIS  Early hidden bronchospasm may go for long periods of time without being detected. This is especially true if wheezing cannot be detected by your caregiver. Lung (pulmonary) function studies may help with diagnosis in these cases. HOME CARE INSTRUCTIONS   It is necessary to remain calm during an attack. Try to relax and breathe more slowly. During this time medications may be given. If any breathing problems seem to be getting worse and are unresponsive to treatment seek immediate medical care.  If you have severe breathing difficulty or have had a life threatening attack it is probably a good idea for you to learn how to give adrenaline (epi-pen) or use an anaphylaxis kit. Your caregiver can help you with this. These are the same kits carried by people who have severe allergic reactions. This is especially important if you do not have readily accessible medical care.  With any severe breathing problems where epinephrine (adrenaline) has been given at home call 911 immediately as the delayed reaction may be even more severe. SEEK MEDICAL CARE IF:   There is wheezing and shortness of breath, even if medications are given to prevent attacks.  An oral  temperature above 102 F (38.9 C) develops.  There are muscle aches, chest pain, or thickening of sputum.  The sputum changes from clear or white to yellow, green, gray,  or bloody.  There are problems that may be related to the medicine you are given, such as a rash, itching, swelling, or trouble breathing. SEEK IMMEDIATE MEDICAL CARE IF:   The usual medicines do not stop your wheezing, or there is increased coughing.  You have increased difficulty breathing. MAKE SURE YOU:   Understand these instructions.  Will watch your condition.  Will get help right away if you are not doing well or get worse. Document Released: 02/17/2003 Document Revised: 05/09/2011 Document Reviewed: 10/03/2007 [facility] Patient Information 2013 [place], [place].

## 2012-05-28 ENCOUNTER — Other Ambulatory Visit: Payer: Self-pay | Admitting: Obstetrics and Gynecology

## 2012-05-28 DIAGNOSIS — Z1231 Encounter for screening mammogram for malignant neoplasm of breast: Secondary | ICD-10-CM

## 2012-05-29 ENCOUNTER — Encounter: Payer: Self-pay | Admitting: Obstetrics and Gynecology

## 2012-05-31 ENCOUNTER — Ambulatory Visit (HOSPITAL_COMMUNITY)
Admission: RE | Admit: 2012-05-31 | Discharge: 2012-05-31 | Disposition: A | Discharge status: Home / Self Care | Payer: Managed Care, Other (non HMO) | Source: Ambulatory Visit | Attending: Obstetrics and Gynecology | Admitting: Obstetrics and Gynecology

## 2012-05-31 DIAGNOSIS — Z1231 Encounter for screening mammogram for malignant neoplasm of breast: Secondary | ICD-10-CM | POA: Insufficient documentation

## 2012-07-05 ENCOUNTER — Other Ambulatory Visit: Payer: Self-pay | Admitting: Obstetrics and Gynecology

## 2013-06-11 ENCOUNTER — Ambulatory Visit (INDEPENDENT_AMBULATORY_CARE_PROVIDER_SITE_OTHER): Payer: Managed Care, Other (non HMO) | Admitting: Emergency Medicine

## 2013-06-11 VITALS — BP 122/84 | HR 63 | Temp 98.4°F | Resp 18 | Wt 182.0 lb

## 2013-06-11 DIAGNOSIS — S335XXA Sprain of ligaments of lumbar spine, initial encounter: Secondary | ICD-10-CM

## 2013-06-11 MED ORDER — CYCLOBENZAPRINE HCL 10 MG PO TABS: 10.0000 mg | ORAL_TABLET | Freq: Three times a day (TID) | ORAL | Status: DC | PRN

## 2013-06-11 MED ORDER — ACETAMINOPHEN-CODEINE #3 300-30 MG PO TABS: 1.0000 | ORAL_TABLET | ORAL | Status: DC | PRN

## 2013-06-11 MED ORDER — NAPROXEN SODIUM 550 MG PO TABS: 550.0000 mg | ORAL_TABLET | Freq: Two times a day (BID) | ORAL | Status: DC

## 2013-06-11 NOTE — Progress Notes (Signed)
[facility] [address] [place] [address] [phone]  Date:  06/11/2013   Name:  [name]   DOB:  1966-01-30   MRN:  [id number]  PCP:  Default, Provider, MD    Chief Complaint: Back Pain   History of Present Illness:  [name] is a 48 y.o. very pleasant female patient who presents with the following:  Well until yesterday when she put some storage bins up in the loft.  Since has low back pain on left.  Non radiating.  No associated neuro symptoms.  takin OTC NSAIDS without relief.  Denies other complaint or health concern today.   There are no active problems to display for this patient.   Past Medical History  Diagnosis Date  . Depression   . Myocardial infarction   . Hypertension     Past Surgical History  Procedure Laterality Date  . Cholecystectomy    . Cardiac bypass    . Abdominal hysterectomy    . Lysis of condyloma    . Coronary artery bypass graft      History  Substance Use Topics  . Smoking status: Current Every Day Smoker -- 1.00 packs/day for 37 years    Types: Cigarettes  . Smokeless tobacco: Not on file  . Alcohol Use: 0.0 oz/week    1-2 Cans of beer per week    Family History  Problem Relation Age of Onset  . Heart disease Mother   . Hyperlipidemia Mother   . Hypertension Mother   . Heart disease Maternal Grandmother   . Cancer Brother     No Known Allergies  Medication list has been reviewed and updated.  Current Outpatient Prescriptions on File Prior to Visit  Medication Sig Dispense Refill  . aspirin 325 MG tablet Take 325 mg by mouth daily.      Marland Kitchen atorvastatin (LIPITOR) 40 MG tablet Take 40 mg by mouth daily.      . metoprolol succinate (TOPROL-XL) 50 MG 24 hr tablet Take 50 mg by mouth daily. Take with or immediately following a meal.      . ranolazine (RANEXA) 500 MG 12 hr tablet Take 500 mg by mouth 2 (two) times daily.       No current facility-administered medications on file  prior to visit.    Review of Systems:  As per HPI, otherwise negative.    Physical Examination: Filed Vitals:   06/11/13 0848  BP: 122/84  Pulse: 63  Temp: 98.4 F (36.9 C)  Resp: 18   Filed Vitals:   06/11/13 0848  Weight: 182 lb (82.555 kg)   Body mass index is 30.29 kg/(m^2). Ideal Body Weight:     GEN: WDWN, NAD, Non-toxic, Alert & Oriented x 3 HEENT: Atraumatic, Normocephalic.  Ears and Nose: No external deformity. EXTR: No clubbing/cyanosis/edema NEURO: Normal gait.  PSYCH: Normally interactive. Conversant. Not depressed or anxious appearing.  Calm demeanor.  Left lumbar paraspinous muscles tender with spasm.  Right normal.  Neuro and motor intact Assessment and Plan: Lumbar stratin Anaprox Flexeril tyl #3  Signed,  Phillips Odor, MD

## 2013-08-24 ENCOUNTER — Ambulatory Visit (INDEPENDENT_AMBULATORY_CARE_PROVIDER_SITE_OTHER): Payer: Managed Care, Other (non HMO) | Admitting: Internal Medicine

## 2013-08-24 VITALS — BP 122/78 | HR 62 | Temp 98.5°F | Resp 18 | Ht 65.0 in | Wt 181.5 lb

## 2013-08-24 DIAGNOSIS — R21 Rash and other nonspecific skin eruption: Secondary | ICD-10-CM

## 2013-08-24 DIAGNOSIS — I2510 Atherosclerotic heart disease of native coronary artery without angina pectoris: Secondary | ICD-10-CM | POA: Insufficient documentation

## 2013-08-24 DIAGNOSIS — L723 Sebaceous cyst: Secondary | ICD-10-CM

## 2013-08-24 DIAGNOSIS — I7090 Unspecified atherosclerosis: Secondary | ICD-10-CM

## 2013-08-24 LAB — POCT SKIN KOH: Skin KOH, POC: NEGATIVE

## 2013-08-24 MED ORDER — CLOTRIMAZOLE-BETAMETHASONE 1-0.05 % EX CREA: 1.0000 "application " | TOPICAL_CREAM | Freq: Two times a day (BID) | CUTANEOUS | Status: DC

## 2013-08-24 NOTE — Progress Notes (Signed)
Procedure Note: Verbal consent obtained from the patient.  Local anesthesia with 1cc 1% plain lidocaine with epinephrine.  Sterile prep and drape.  42mm punch biospy performed over the dilated pore.  Copious sebaceous material expressed.  Blunt dissection around the cyst wall.  Cyst wall removed in pieces with both blunt and sharp dissection.  Cyst wall appeared to be completely removed.  Irrigated with remaining anesthetic.  Closed with #3 sutures of 5-0 ethilon (#2 VM, #1SI).  Cleansed and dressed.  Pt tolerated very well.  Anticipate suture removal in 7-10 days.

## 2013-08-24 NOTE — Progress Notes (Signed)
   Subjective:    Patient ID: [name], female    DOB: 1965/12/20, 48 y.o.   MRN: [id number]  HPI  Chief Complaint  Patient presents with  . Rash    on back--pruritic /1 spot /no certainty of initial cause such as insect bite /has not responded to topical preparations for itching or irritation /works with animals //no new medications /no outdoor exposures to poison ivy / no history of eczema  . Mass    on right shoulder blade-for many months but only recently larger and more tender  No fever    Underlying arteriosclerotic cardiovascular disease/C. medications    Review of Systems No fever chills or night sweats No chest pain The shortness of breath No peripheral edema    Objective:   Physical Exam BP 122/78  Pulse 62  Temp(Src) 98.5 F (36.9 C) (Oral)  Resp 18  Ht 5\' 5"  (1.651 m)  Wt 181 lb 8 oz (82.328 kg)  BMI 30.20 kg/m2  SpO2 98% No acute distress Along the medial scapular border on the right is today 4 cm oval patch with crusting and erythema, a distinct border, no central clearing, no vesiculation, no surrounding cellulitis Scraping for KOH and appears negative  Over the right posterior shoulder there is a tender movable 3 cm mass that is fluctuant but not erythematous       Assessment & Plan:  Rash and nonspecific skin eruption - ?etio  treat with Lotrisone and followup in 2-4 weeks  Inflamed sebaceous cyst-see surgical procedure/successful  Suture removal 7-10 days.  ASCVD (arteriosclerotic cardiovascular disease)

## 2013-09-02 ENCOUNTER — Ambulatory Visit (INDEPENDENT_AMBULATORY_CARE_PROVIDER_SITE_OTHER): Payer: Managed Care, Other (non HMO) | Admitting: Physician Assistant

## 2013-09-02 VITALS — BP 126/78 | HR 74 | Temp 98.6°F | Resp 16 | Ht 66.5 in | Wt 184.4 lb

## 2013-09-02 DIAGNOSIS — L723 Sebaceous cyst: Secondary | ICD-10-CM

## 2013-09-02 DIAGNOSIS — L089 Local infection of the skin and subcutaneous tissue, unspecified: Secondary | ICD-10-CM

## 2013-09-02 MED ORDER — FLUCONAZOLE 150 MG PO TABS: 150.0000 mg | ORAL_TABLET | Freq: Once | ORAL | Status: DC

## 2013-09-02 MED ORDER — AMOXICILLIN-POT CLAVULANATE 875-125 MG PO TABS: 1.0000 | ORAL_TABLET | Freq: Two times a day (BID) | ORAL | Status: DC

## 2013-09-02 NOTE — Progress Notes (Signed)
   Subjective:    Patient ID: [name], female    DOB: 01-26-1966, 48 y.o.   MRN: [id number]  HPI 48 year old female presents for suture removal. Had a sebaceous cyst removed from her right shoulder on 6/27. States she has noticed that it is slightly erythematous and she has noted some drainage from the wound.  No significant amount of pain, warmth, or induration.     Review of Systems  Constitutional: Negative for fever and chills.  Skin: Positive for color change and wound.       Objective:   Physical Exam  Constitutional: She is oriented to person, place, and time. She appears well-developed and well-nourished.  HENT:  Head: Normocephalic and atraumatic.  Right Ear: External ear normal.  Left Ear: External ear normal.  Eyes: Conjunctivae are normal.  Neck: Normal range of motion.  Cardiovascular: Normal rate.   Pulmonary/Chest: Effort normal.  Neurological: She is alert and oriented to person, place, and time.  Skin:     Psychiatric: She has a normal mood and affect. Her behavior is normal. Judgment and thought content normal.      Sutures removed without difficulty. Small amount of sebaceous and purulent material expressed. There is a central opening about 2 mm in diameter.  Irrigated, cleaned, and bandaged.  Patient tolerated well.     Assessment & Plan:  Infected sebaceous cyst - Plan: amoxicillin-clavulanate (AUGMENTIN) 875-125 MG per tablet, Wound culture  Likely reaction due to retained sebaceous material, but wound culture sent  Start Augmentin 875 mg bid x 10 days. RTC precautions discussed Follow up if symptoms worsening or fail to improve.

## 2013-09-04 LAB — WOUND CULTURE
Gram Stain: NONE SEEN
Gram Stain: NONE SEEN
Gram Stain: NONE SEEN
Organism ID, Bacteria: NO GROWTH

## 2013-10-09 ENCOUNTER — Ambulatory Visit (INDEPENDENT_AMBULATORY_CARE_PROVIDER_SITE_OTHER): Payer: Managed Care, Other (non HMO) | Admitting: Family Medicine

## 2013-10-09 VITALS — BP 118/80 | HR 68 | Temp 98.0°F | Resp 18 | Ht 66.0 in | Wt 186.0 lb

## 2013-10-09 DIAGNOSIS — R21 Rash and other nonspecific skin eruption: Secondary | ICD-10-CM

## 2013-10-09 DIAGNOSIS — B354 Tinea corporis: Secondary | ICD-10-CM

## 2013-10-09 LAB — POCT SKIN KOH: SKIN KOH, POC: NEGATIVE

## 2013-10-09 MED ORDER — CLOTRIMAZOLE-BETAMETHASONE 1-0.05 % EX CREA: 1.0000 "application " | TOPICAL_CREAM | Freq: Two times a day (BID) | CUTANEOUS | Status: DC

## 2013-10-09 NOTE — Progress Notes (Addendum)
Subjective:  This chart was scribed for Meredith StaggersJeffrey Willy Vorce, MD by Elveria Risingimelie Horne, Medial Scribe. This patient was seen in room 9 and the patient's care was started at 8:12 PM.    Patient ID: [name], female    DOB: Oct 06, 1965, 48 y.o.   MRN: [id number]  HPI HPI Comments: [name] is a 48 y.o. female who presents to the [facility].  Patient seen at the end of June for sebaceous cyst removal and July 6th with infected cyst. Treated with Augmentin.   Patient reports unresolved rash on right upper back present at her last visit 08/24/13. Patient prescribed Lotrisone cream at visit. She reports treatment with cream twice a day, without any improvement. She denies growth of the rash, but reports severe pruritis. She denies rash elsewhere.    Patient has not seen dermatologist.   No hx of psoriasis  Patient Active Problem List   Diagnosis Date Noted  . ASCVD (arteriosclerotic cardiovascular disease) 08/24/2013   Past Medical History  Diagnosis Date  . Depression   . Myocardial infarction   . Hypertension    Past Surgical History  Procedure Laterality Date  . Cholecystectomy    . Cardiac bypass    . Abdominal hysterectomy    . Lysis of condyloma    . Coronary artery bypass graft     No Known Allergies Prior to Admission medications   Medication Sig Start Date End Date Taking? Authorizing Provider  amoxicillin-clavulanate (AUGMENTIN) 875-125 MG per tablet Take 1 tablet by mouth 2 (two) times daily. 09/02/13  Yes Heather M Marte, PA-C  aspirin 325 MG tablet Take 325 mg by mouth daily.   Yes Historical Provider, MD  atorvastatin (LIPITOR) 40 MG tablet Take 40 mg by mouth daily.   Yes Historical Provider, MD  isosorbide mononitrate (IMDUR) 60 MG 24 hr tablet Take 60 mg by mouth daily.   Yes Historical Provider, MD  metoprolol succinate (TOPROL-XL) 50 MG 24 hr tablet Take 50 mg by mouth daily. Take with or immediately following a meal.   Yes Historical  Provider, MD  ranolazine (RANEXA) 500 MG 12 hr tablet Take 500 mg by mouth 2 (two) times daily.   Yes Historical Provider, MD  clotrimazole-betamethasone (LOTRISONE) cream Apply 1 application topically 2 (two) times daily. For up to 30 days 08/24/13   Tonye Pearsonobert P Doolittle, MD  fluconazole (DIFLUCAN) 150 MG tablet Take 1 tablet (150 mg total) by mouth once. 09/02/13   Nelva NayHeather M Marte, PA-C   History   Social History  . Marital Status: Single    Spouse Name: N/A    Number of Children: N/A  . Years of Education: N/A   Occupational History  . Not on file.   Social History Main Topics  . Smoking status: Current Every Day Smoker -- 1.00 packs/day for 37 years    Types: Cigarettes  . Smokeless tobacco: Not on file  . Alcohol Use: 0.0 oz/week    1-2 Cans of beer per week  . Drug Use: No  . Sexual Activity: Not on file   Other Topics Concern  . Not on file   Social History Narrative  . No narrative on file     Review of Systems     Objective:   Physical Exam  Constitutional: She is oriented to person, place, and time. She appears well-developed and well-nourished. No distress.  Pulmonary/Chest: Effort normal.  Neurological: She is alert and oriented to person, place, and time.  Skin: Skin is warm and dry.  Elevated patch appro. 6 cm x 4 cm on right uppper back with some cental clearing and excoriated areas around the periphery but no surrounding erythema.   Psychiatric: She has a normal mood and affect. Her behavior is normal.   Filed Vitals:   10/09/13 1835  BP: 118/80  Pulse: 68  Temp: 98 F (36.7 C)  TempSrc: Oral  Resp: 18  Height: 5\' 6"  (1.676 m)  Weight: 186 lb (84.369 kg)  SpO2: 98%    Results for orders placed in visit on 10/09/13  POCT SKIN KOH      Result Value Ref Range   Skin KOH, POC Negative        Assessment & Plan:   [name] is a 48 y.o. female Rash and nonspecific skin eruption - Plan: POCT Skin KOH, clotrimazole-betamethasone  (LOTRISONE) cream  Tinea corporis - Plan: POCT Skin KOH, clotrimazole-betamethasone (LOTRISONE) cream  Still appears to be tinea corporis with persistence possibly from only QD dosing at times. contineu Lotrisone for few more weeks, but if not improving consider other etiology - guttate psoriasis, contact derm - less likely. May need derm eval or biopsy. rtc precautions given.    Meds ordered this encounter  Medications  . clotrimazole-betamethasone (LOTRISONE) cream    Sig: Apply 1 application topically 2 (two) times daily. For up to 30 days    Dispense:  30 g    Refill:  0   Patient Instructions  Your rash still appears to be ringworm, so can try a few more weeks of cream but apply 2 times per day every day.  If not improving in next few weeks - let me know and I can refer you to dermatologist.  If improving - use cream for at least 1 week after rash resolves ( and can change to just clotrimazole over the counter twice per day).   Body Ringworm Ringworm (tinea corporis) is a fungal infection of the skin on the body. This infection is not caused by worms, but is actually caused by a fungus. Fungus normally lives on the top of your skin and can be useful. However, in the case of ringworms, the fungus grows out of control and causes a skin infection. It can involve any area of skin on the body and can spread easily from one person to another (contagious). Ringworm is a common problem for children, but it can affect adults as well. Ringworm is also often found in athletes, especially wrestlers who share equipment and mats.  CAUSES  Ringworm of the body is caused by a fungus called dermatophyte. It can spread by:  Touchingother people who are infected.  Touchinginfected pets.  Touching or sharingobjects that have been in contact with the infected person or pet (hats, combs, towels, clothing, sports equipment). SYMPTOMS   Itchy, raised red spots and bumps on the skin.  Ring-shaped  rash.  Redness near the border of the rash with a clear center.  Dry and scaly skin on or around the rash. Not every person develops a ring-shaped rash. Some develop only the red, scaly patches. DIAGNOSIS  Most often, ringworm can be diagnosed by performing a skin exam. Your caregiver may choose to take a skin scraping from the affected area. The sample will be examined under the microscope to see if the fungus is present.  TREATMENT  Body ringworm may be treated with a topical antifungal cream or ointment. Sometimes, an antifungal shampoo that can be  used on your body is prescribed. You may be prescribed antifungal medicines to take by mouth if your ringworm is severe, keeps coming back, or lasts a long time.  HOME CARE INSTRUCTIONS   Only take over-the-counter or prescription medicines as directed by your caregiver.  Wash the infected area and dry it completely before applying yourcream or ointment.  When using antifungal shampoo to treat the ringworm, leave the shampoo on the body for 3-5 minutes before rinsing.   Wear loose clothing to stop clothes from rubbing and irritating the rash.  Wash or change your bed sheets every night while you have the rash.  Have your pet treated by your veterinarian if it has the same infection. To prevent ringworm:   Practice good hygiene.  Wear sandals or shoes in public places and showers.  Do not share personal items with others.  Avoid touching red patches of skin on other people.  Avoid touching pets that have bald spots or wash your hands after doing so. SEEK MEDICAL CARE IF:   Your rash continues to spread after 7 days of treatment.  Your rash is not gone in 4 weeks.  The area around your rash becomes red, warm, tender, and swollen. Document Released: 02/12/2000 Document Revised: 11/09/2011 Document Reviewed: 08/29/2011 [facility] Patient Information 2015 [place], [place]. This information is not intended to replace advice given to  you by your health care provider. Make sure you discuss any questions you have with your health care provider.       I personally performed the services described in this documentation, which was scribed in my presence. The recorded information has been reviewed and considered, and addended by me as needed.

## 2013-10-09 NOTE — Patient Instructions (Signed)
Your rash still appears to be ringworm, so can try a few more weeks of cream but apply 2 times per day every day.  If not improving in next few weeks - let me know and I can refer you to dermatologist.  If improving - use cream for at least 1 week after rash resolves ( and can change to just clotrimazole over the counter twice per day).   Body Ringworm Ringworm (tinea corporis) is a fungal infection of the skin on the body. This infection is not caused by worms, but is actually caused by a fungus. Fungus normally lives on the top of your skin and can be useful. However, in the case of ringworms, the fungus grows out of control and causes a skin infection. It can involve any area of skin on the body and can spread easily from one person to another (contagious). Ringworm is a common problem for children, but it can affect adults as well. Ringworm is also often found in athletes, especially wrestlers who share equipment and mats.  CAUSES  Ringworm of the body is caused by a fungus called dermatophyte. It can spread by:  Touchingother people who are infected.  Touchinginfected pets.  Touching or sharingobjects that have been in contact with the infected person or pet (hats, combs, towels, clothing, sports equipment). SYMPTOMS   Itchy, raised red spots and bumps on the skin.  Ring-shaped rash.  Redness near the border of the rash with a clear center.  Dry and scaly skin on or around the rash. Not every person develops a ring-shaped rash. Some develop only the red, scaly patches. DIAGNOSIS  Most often, ringworm can be diagnosed by performing a skin exam. Your caregiver may choose to take a skin scraping from the affected area. The sample will be examined under the microscope to see if the fungus is present.  TREATMENT  Body ringworm may be treated with a topical antifungal cream or ointment. Sometimes, an antifungal shampoo that can be used on your body is prescribed. You may be prescribed  antifungal medicines to take by mouth if your ringworm is severe, keeps coming back, or lasts a long time.  HOME CARE INSTRUCTIONS   Only take over-the-counter or prescription medicines as directed by your caregiver.  Wash the infected area and dry it completely before applying yourcream or ointment.  When using antifungal shampoo to treat the ringworm, leave the shampoo on the body for 3-5 minutes before rinsing.   Wear loose clothing to stop clothes from rubbing and irritating the rash.  Wash or change your bed sheets every night while you have the rash.  Have your pet treated by your veterinarian if it has the same infection. To prevent ringworm:   Practice good hygiene.  Wear sandals or shoes in public places and showers.  Do not share personal items with others.  Avoid touching red patches of skin on other people.  Avoid touching pets that have bald spots or wash your hands after doing so. SEEK MEDICAL CARE IF:   Your rash continues to spread after 7 days of treatment.  Your rash is not gone in 4 weeks.  The area around your rash becomes red, warm, tender, and swollen. Document Released: 02/12/2000 Document Revised: 11/09/2011 Document Reviewed: 08/29/2011 [facility] Patient Information 2015 [place], [place]. This information is not intended to replace advice given to you by your health care provider. Make sure you discuss any questions you have with your health care provider.

## 2013-11-05 ENCOUNTER — Other Ambulatory Visit (HOSPITAL_COMMUNITY): Payer: Self-pay | Admitting: Obstetrics and Gynecology

## 2013-11-05 DIAGNOSIS — Z1231 Encounter for screening mammogram for malignant neoplasm of breast: Secondary | ICD-10-CM

## 2013-11-20 ENCOUNTER — Ambulatory Visit (HOSPITAL_COMMUNITY)
Admission: RE | Admit: 2013-11-20 | Discharge: 2013-11-20 | Disposition: A | Discharge status: Home / Self Care | Payer: Managed Care, Other (non HMO) | Source: Ambulatory Visit | Attending: Obstetrics and Gynecology | Admitting: Obstetrics and Gynecology

## 2013-11-20 DIAGNOSIS — Z1231 Encounter for screening mammogram for malignant neoplasm of breast: Secondary | ICD-10-CM | POA: Insufficient documentation

## 2013-12-05 DIAGNOSIS — I10 Essential (primary) hypertension: Secondary | ICD-10-CM | POA: Diagnosis present

## 2013-12-05 DIAGNOSIS — Z9889 Other specified postprocedural states: Secondary | ICD-10-CM

## 2013-12-05 DIAGNOSIS — K219 Gastro-esophageal reflux disease without esophagitis: Secondary | ICD-10-CM | POA: Insufficient documentation

## 2013-12-05 DIAGNOSIS — Z951 Presence of aortocoronary bypass graft: Secondary | ICD-10-CM

## 2014-05-28 ENCOUNTER — Other Ambulatory Visit: Payer: Self-pay | Admitting: Urology

## 2014-06-12 ENCOUNTER — Encounter (HOSPITAL_BASED_OUTPATIENT_CLINIC_OR_DEPARTMENT_OTHER): Payer: Self-pay | Admitting: *Deleted

## 2014-06-13 ENCOUNTER — Emergency Department (HOSPITAL_BASED_OUTPATIENT_CLINIC_OR_DEPARTMENT_OTHER): Payer: Managed Care, Other (non HMO)

## 2014-06-13 ENCOUNTER — Emergency Department (HOSPITAL_BASED_OUTPATIENT_CLINIC_OR_DEPARTMENT_OTHER)
Admission: EM | Admit: 2014-06-13 | Discharge: 2014-06-13 | Disposition: A | Discharge status: Home / Self Care | Payer: Managed Care, Other (non HMO) | Attending: Emergency Medicine | Admitting: Emergency Medicine

## 2014-06-13 ENCOUNTER — Encounter (HOSPITAL_BASED_OUTPATIENT_CLINIC_OR_DEPARTMENT_OTHER): Payer: Self-pay

## 2014-06-13 ENCOUNTER — Encounter (HOSPITAL_BASED_OUTPATIENT_CLINIC_OR_DEPARTMENT_OTHER): Payer: Self-pay | Admitting: *Deleted

## 2014-06-13 DIAGNOSIS — Z7982 Long term (current) use of aspirin: Secondary | ICD-10-CM | POA: Diagnosis not present

## 2014-06-13 DIAGNOSIS — R0602 Shortness of breath: Secondary | ICD-10-CM | POA: Diagnosis present

## 2014-06-13 DIAGNOSIS — Z8742 Personal history of other diseases of the female genital tract: Secondary | ICD-10-CM | POA: Insufficient documentation

## 2014-06-13 DIAGNOSIS — G8929 Other chronic pain: Secondary | ICD-10-CM | POA: Diagnosis not present

## 2014-06-13 DIAGNOSIS — F319 Bipolar disorder, unspecified: Secondary | ICD-10-CM | POA: Diagnosis not present

## 2014-06-13 DIAGNOSIS — J209 Acute bronchitis, unspecified: Secondary | ICD-10-CM | POA: Insufficient documentation

## 2014-06-13 DIAGNOSIS — Z951 Presence of aortocoronary bypass graft: Secondary | ICD-10-CM | POA: Insufficient documentation

## 2014-06-13 DIAGNOSIS — I2510 Atherosclerotic heart disease of native coronary artery without angina pectoris: Secondary | ICD-10-CM | POA: Diagnosis not present

## 2014-06-13 DIAGNOSIS — E785 Hyperlipidemia, unspecified: Secondary | ICD-10-CM | POA: Diagnosis not present

## 2014-06-13 DIAGNOSIS — I252 Old myocardial infarction: Secondary | ICD-10-CM | POA: Diagnosis not present

## 2014-06-13 DIAGNOSIS — Z8719 Personal history of other diseases of the digestive system: Secondary | ICD-10-CM | POA: Diagnosis not present

## 2014-06-13 DIAGNOSIS — Z79899 Other long term (current) drug therapy: Secondary | ICD-10-CM | POA: Diagnosis not present

## 2014-06-13 DIAGNOSIS — I10 Essential (primary) hypertension: Secondary | ICD-10-CM | POA: Diagnosis not present

## 2014-06-13 DIAGNOSIS — J40 Bronchitis, not specified as acute or chronic: Secondary | ICD-10-CM

## 2014-06-13 DIAGNOSIS — Z720 Tobacco use: Secondary | ICD-10-CM | POA: Insufficient documentation

## 2014-06-13 MED ORDER — AZITHROMYCIN 250 MG PO TABS
250.0000 mg | ORAL_TABLET | Freq: Every day | ORAL | Status: DC
Start: 2014-06-13 — End: 2014-08-22

## 2014-06-13 MED ORDER — PREDNISONE 20 MG PO TABS: 60.0000 mg | ORAL_TABLET | Freq: Once | ORAL | Status: DC

## 2014-06-13 MED ORDER — ALBUTEROL SULFATE (2.5 MG/3ML) 0.083% IN NEBU
5.0000 mg | INHALATION_SOLUTION | Freq: Once | RESPIRATORY_TRACT | Status: AC
  Administered 2014-06-13: 5 mg via RESPIRATORY_TRACT
  Filled 2014-06-13: qty 6

## 2014-06-13 MED ORDER — PREDNISONE 10 MG PO TABS
60.0000 mg | ORAL_TABLET | Freq: Once | ORAL | Status: AC
  Administered 2014-06-13: 60 mg via ORAL
  Filled 2014-06-13 (×2): qty 1

## 2014-06-13 MED ORDER — ALBUTEROL SULFATE HFA 108 (90 BASE) MCG/ACT IN AERS
2.0000 | INHALATION_SPRAY | RESPIRATORY_TRACT | Status: DC | PRN
  Administered 2014-06-13: 2 via RESPIRATORY_TRACT
  Filled 2014-06-13: qty 6.7

## 2014-06-13 MED ORDER — ALBUTEROL SULFATE HFA 108 (90 BASE) MCG/ACT IN AERS: 2.0000 | INHALATION_SPRAY | RESPIRATORY_TRACT | Status: DC | PRN

## 2014-06-13 NOTE — ED Notes (Signed)
Pt c/o productive cough/congestion/SOB/wheezing getting worse over the past 2wks, hx of bronchitis and feels the same, treating with OTC meds with no relief

## 2014-06-13 NOTE — Progress Notes (Addendum)
NPO AFTER MN. ARRIVE AT 0830. NEEDS ISTAT. CURRENT EKG, LAST STRESS TEST, ECHO, OFFICE VISIT NOTE TO BE FAXED FROM DR PARASCHOUS [facility]).  WILL TAKE AM MEDS W/ EXCEPTION NO ASA THAT AM DOS , W/ SIPS OF WATER.  SPOKE W/ PAM AT DR MACDIARMID OFFICE, AWAITING FOR CARDIAC CLEARANCE.     REVIEWED CHART W/ DR ROSE MDA, OK TO PROCEED , MDA WILL DO ASSESSMENT DOS SINCE DX BRONCHITIS 06-13-2014.

## 2014-06-13 NOTE — Discharge Instructions (Signed)
You were seen today for likely bronchitis. Your chest x-ray shows airway thickening that could be early bronchopneumonia. He'll be given antibiotics. You need to have a follow-up chest x-ray in 4-5 weeks.  Acute Bronchitis Bronchitis is inflammation of the airways that extend from the windpipe into the lungs (bronchi). The inflammation often causes mucus to develop. This leads to a cough, which is the most common symptom of bronchitis.  In acute bronchitis, the condition usually develops suddenly and goes away over time, usually in a couple weeks. Smoking, allergies, and asthma can make bronchitis worse. Repeated episodes of bronchitis may cause further lung problems.  CAUSES Acute bronchitis is most often caused by the same virus that causes a cold. The virus can spread from person to person (contagious) through coughing, sneezing, and touching contaminated objects. SIGNS AND SYMPTOMS   Cough.   Fever.   Coughing up mucus.   Body aches.   Chest congestion.   Chills.   Shortness of breath.   Sore throat.  DIAGNOSIS  Acute bronchitis is usually diagnosed through a physical exam. Your health care provider will also ask you questions about your medical history. Tests, such as chest X-rays, are sometimes done to rule out other conditions.  TREATMENT  Acute bronchitis usually goes away in a couple weeks. Oftentimes, no medical treatment is necessary. Medicines are sometimes given for relief of fever or cough. Antibiotic medicines are usually not needed but may be prescribed in certain situations. In some cases, an inhaler may be recommended to help reduce shortness of breath and control the cough. A cool mist vaporizer may also be used to help thin bronchial secretions and make it easier to clear the chest.  HOME CARE INSTRUCTIONS  Get plenty of rest.   Drink enough fluids to keep your urine clear or pale yellow (unless you have a medical condition that requires fluid  restriction). Increasing fluids may help thin your respiratory secretions (sputum) and reduce chest congestion, and it will prevent dehydration.   Take medicines only as directed by your health care provider.  If you were prescribed an antibiotic medicine, finish it all even if you start to feel better.  Avoid smoking and secondhand smoke. Exposure to cigarette smoke or irritating chemicals will make bronchitis worse. If you are a smoker, consider using nicotine gum or skin patches to help control withdrawal symptoms. Quitting smoking will help your lungs heal faster.   Reduce the chances of another bout of acute bronchitis by washing your hands frequently, avoiding people with cold symptoms, and trying not to touch your hands to your mouth, nose, or eyes.   Keep all follow-up visits as directed by your health care provider.  SEEK MEDICAL CARE IF: Your symptoms do not improve after 1 week of treatment.  SEEK IMMEDIATE MEDICAL CARE IF:  You develop an increased fever or chills.   You have chest pain.   You have severe shortness of breath.  You have bloody sputum.   You develop dehydration.  You faint or repeatedly feel like you are going to pass out.  You develop repeated vomiting.  You develop a severe headache. MAKE SURE YOU:   Understand these instructions.  Will watch your condition.  Will get help right away if you are not doing well or get worse. Document Released: 03/24/2004 Document Revised: 07/01/2013 Document Reviewed: 08/07/2012 [facility] Patient Information 2015 [place], [place]. This information is not intended to replace advice given to you by your health care provider. Make sure you  discuss any questions you have with your health care provider. ° °

## 2014-06-13 NOTE — ED Provider Notes (Signed)
CSN: [id number]     Arrival date & time 06/13/14  2013 History  This chart was scribed for Shon Baton, MD by Roxy Cedar, ED Scribe. This patient was seen in room MH11/MH11 and the patient's care was started at 9:52 PM.   Chief Complaint  Patient presents with  . Shortness of Breath   Patient is a 49 y.o. female presenting with shortness of breath. The history is provided by the patient. No language interpreter was used.  Shortness of Breath Severity:  Moderate Onset quality:  Gradual Duration:  2 weeks Timing:  Constant Progression:  Waxing and waning Chronicity:  Recurrent Relieved by:  Nothing Worsened by:  Nothing tried Associated symptoms: cough and wheezing   Associated symptoms: no abdominal pain, no chest pain, no fever, no headaches and no vomiting    HPI Comments: [name] is a 49 y.o. female with a PMHx of hypertension, MI, s/p CABG x4, s/p drug eluting coronary stent placement x2, GERD, bipolar 1 disorder, SUI, 3-vessel CAD, chronic chest pain, hyperlipidemia, and bronchitis, who presents to the Emergency Department complaining of moderate shortness of breath onset 1.5 weeks ago. Patient reports gradually worsening associated cough, congestion, and wheezing. She states that she has a hx of bronchitis and current symptoms are concurrent with those experienced in the past. Patient reports fever today of 100 degrees F. Patient states that she has been taking Muccinex medications with no relief. Patient is a smoker. She denies hx of COPD.  Past Medical History  Diagnosis Date  . Hypertension   . History of MI (myocardial infarction)   . S/P CABG x 4     09-10-2002  . S/P drug eluting coronary stent placement     X2   in 2005  . GERD (gastroesophageal reflux disease)   . Bipolar 1 disorder   . SUI (stress urinary incontinence, female)   . Family history of premature CAD   . Coronary artery disease   . 3-vessel CAD     cardiologist -  dr Ladona Mow  [facility])  . Chronic chest pain     controlled w/ meds and prn nitro  . Hyperlipidemia   . Bronchitis    Past Surgical History  Procedure Laterality Date  . Laparoscopic cholecystectomy  04-04-2002  . Vaginal hysterectomy  10-07-2003  . Excision left arm mass  02-05-2007  . Cystourethropexy/  sparc  sling  06-25-2009  . 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 Coronary artery bypass graft  09-10-2002   dr Marilu Favre [facility]    x4--  LIMA to LAD,  RIMA to OM2, SVG to PDA and POSTEROLATERAL  . Coronary angioplasty with stent placement  oct 2005   [facility]    post CABG--  DES x2 to SVG to RCA for PDA and Posterolateral occulsion  . Cardiac catheterization  08-31-2006  dr hochrein    Severe native three-vessel CAD/  Occluded vein graft to the  RCA,  LM normal,  RIMA to OM and LIMA to LAD widely patent/  slightly reduced ef with regional wall motion abnormality,  ef 55% with mild inferior hypokinesis/  continued aggressive medical management  . Cardiac catheterization  11-19-2009  dr Darrold Junker [facility]    Patent LIMA to LAD, patent RIMA to OM1 with occluded native vessel, occluded RCA with occluded SVG to PDA. Collaterals from LAD to PDA and OM1 present/  diaphragmatic akinesis,  ef 55%   Family History  Problem Relation  Age of Onset  . Heart disease Mother   . Hyperlipidemia Mother   . Hypertension Mother   . Heart disease Maternal Grandmother   . Cancer Brother    History  Substance Use Topics  . Smoking status: Current Every Day Smoker -- 1.00 packs/day for 37 years    Types: Cigarettes  . Smokeless tobacco: Never Used  . Alcohol Use: Yes     Comment: RARE   OB History    No data available     Review of Systems  Constitutional: Negative for fever.  HENT: Positive for congestion.   Respiratory: Positive for cough, shortness of breath and wheezing. Negative for chest tightness.   Cardiovascular: Negative for chest pain.   Gastrointestinal: Negative for nausea, vomiting and abdominal pain.  Genitourinary: Negative for dysuria.  Musculoskeletal: Negative for back pain and arthralgias.  Skin: Negative for wound.  Neurological: Negative for headaches.  Psychiatric/Behavioral: Negative for confusion.  All other systems reviewed and are negative.  Allergies  Review of patient's allergies indicates no known allergies.  Home Medications   Prior to Admission medications   Medication Sig Start Date End Date Taking? Authorizing Provider  albuterol (PROVENTIL HFA;VENTOLIN HFA) 108 (90 BASE) MCG/ACT inhaler Inhale 2 puffs into the lungs every 4 (four) hours as needed for wheezing or shortness of breath. 06/13/14   Shon Baton, MD  aspirin 325 MG tablet Take 325 mg by mouth every morning.     Historical Provider, MD  atorvastatin (LIPITOR) 40 MG tablet Take 40 mg by mouth every morning.     Historical Provider, MD  azithromycin (ZITHROMAX) 250 MG tablet Take 1 tablet (250 mg total) by mouth daily. Take first 2 tablets together, then 1 every day until finished. 06/13/14   Shon Baton, MD  calcium carbonate (TUMS - DOSED IN MG ELEMENTAL CALCIUM) 500 MG chewable tablet Chew 1 tablet by mouth as needed for indigestion or heartburn.    Historical Provider, MD  estradiol (ESTRACE) 0.5 MG tablet Take 0.5 mg by mouth 2 (two) times daily.    Historical Provider, MD  FLUoxetine (PROZAC) 20 MG tablet Take 20 mg by mouth every morning.    Historical Provider, MD  isosorbide mononitrate (IMDUR) 60 MG 24 hr tablet Take 60 mg by mouth 2 (two) times daily.     Historical Provider, MD  metoprolol succinate (TOPROL-XL) 50 MG 24 hr tablet Take 50 mg by mouth every morning. Take with or immediately following a meal.    Historical Provider, MD  nitroGLYCERIN (NITROSTAT) 0.4 MG SL tablet Place 0.4 mg under the tongue every 5 (five) minutes as needed for chest pain.    Historical Provider, MD  predniSONE (DELTASONE) 20 MG tablet Take  3 tablets (60 mg total) by mouth once. 06/13/14   Shon Baton, MD  ranolazine (RANEXA) 1000 MG SR tablet Take 1,000 mg by mouth 2 (two) times daily.    Historical Provider, MD   Triage Vitals: BP 129/88 mmHg  Pulse 79  Temp(Src) 100 F (37.8 C) (Oral)  Resp 22  Ht  (1.651 m)  Wt 189 lb (85.73 kg)  BMI 31.45 kg/m2  SpO2 96%  Physical Exam  Constitutional: She is oriented to person, place, and time. She appears well-developed and well-nourished. No distress.  HENT:  Head: Normocephalic and atraumatic.  Eyes: Pupils are equal, round, and reactive to light.  Cardiovascular: Normal rate, regular rhythm and normal heart sounds.   No murmur heard. Pulmonary/Chest: Effort normal. No  respiratory distress. She has wheezes.  Abdominal: Soft. Bowel sounds are normal. There is no tenderness. There is no rebound.  Musculoskeletal: She exhibits no edema.  Neurological: She is alert and oriented to person, place, and time.  Skin: Skin is warm and dry.  Psychiatric: She has a normal mood and affect.  Nursing note and vitals reviewed.   ED Course  Procedures (including critical care time)  DIAGNOSTIC STUDIES: Oxygen Saturation is 96% on RA, normal by my interpretation.    COORDINATION OF CARE: 9:30 PM - Ordered diagnostic CXR. Will give patient albuterol breathing treatment.   9:54 PM- Discussed plans to give patient steroid medication and inhaler. Pt advised of plan for treatment and pt agrees.  Labs Review Labs Reviewed - No data to display  Imaging Review Dg Chest 2 View  06/13/2014   CLINICAL DATA:  Shortness of breath with wheeze and cough, worse over the past 2 weeks  EXAM: CHEST  2 VIEW  COMPARISON:  11/13/2008  FINDINGS: Interstitial coarsening above baseline, with indistinct perihilar lung opacities. No edema, effusion, or pneumothorax. Normal heart size and aortic contours post CABG. No acute osseous findings.  IMPRESSION: Bronchitis and perihilar atelectasis or  bronchopneumonia. Recommend chest x-ray follow-up in 3-5 weeks.   Electronically Signed   By: Marnee Spring M.D.   On: 06/13/2014 21:47    EKG Interpretation None     MDM   Final diagnoses:  Bronchitis   Patient presents with cough, fever, and shortness of breath. Wheezing on exam. She is a current smoker. Discussed smoking cessation.  Patient given age of a inhaler and prednisone. Chest x-ray with changes consistent with bronchitis and/or early bronchopneumonia. Given this and fever, will treat with azithromycin. Patient will be discharged home with inhaler, prednisone, and azithromycin. She is otherwise nontoxic-appearing.  After history, exam, and medical workup I feel the patient has been appropriately medically screened and is safe for discharge home. Pertinent diagnoses were discussed with the patient. Patient was given return precautions.  I personally performed the services described in this documentation, which was scribed in my presence. The recorded information has been reviewed and is accurate.   Shon Baton, MD 06/14/14 [phone]

## 2014-06-16 NOTE — H&P (Signed)
History of Present Illness   [name] had a sling in 2011 by myself for mixed stress urge incontinence. The stress component at the time was mild but it bothered her more than the urge component. She had an overactive bladder with sensory urgency and reduced maximum capacity on urodynamics associated with significant leakage. She failed Toviaz, VESIcare, the gel and physical therapy.  After her sling she was dry, but her bladder was still urgent.   In the last 8 months her leaking has worsened. She leaks with coughing and sneezing, sometimes bending and lifting. She has urge incontinence. She says the stress component is more severe. She wears 2 pads a day. They can be moderately wet to soaken. She denies enuresis and gets up twice a night. She had a negative cough test associated with grade 2 hypermobility of the bladder neck. I did not see or feel any sling extrusion. She emptied efficiently. Cystoscopic examination was normal. She is here to discuss her urodynamics.    When she had urodynamics on her last visit in June 2015, she had a negative cough test with a hard cough. I could see some pale mucosa underneath the mid urethra but no extrusion. If she ever had a repeat sling, I thought her first sling might be quite superficial. I would partially remove it at the time of surgery, if it became clinically relevant.   Her leak-point pressure at 200 mL was 95 cmH2O and it was mild. It was 80 cmH2O at 400 mL. Her bladder was stable at a capacity of 500 mL.   I recommended physical therapy and Myrbetriq. I felt approximately 70% of her problem was mild stress incontinence with a mild overactive bladder. We were planning to possibly repeat the sling after physical therapy or even consider urethral injectables. It does not appear she would follow up with a physical therapy team.   Urinalysis: Negative.  Review of Systems: No change in bowel or neurologic systems.   Frequency is stable.    Past  Medical History Problems  1. History of Acute Myocardial Infarction 2. History of cardiac disorder (Z86.79) 3. History of depression (Z86.59) 4. History of hypercholesterolemia (Z86.39) 5. History of hypertension (Z86.79)  Surgical History Problems  1. History of CABG (CABG) 2. History of Cholecystectomy 3. History of Hysterectomy 4. History of Vaginal Sling Operation For Stress Incontinence  Current Meds 1. Aspirin 325 MG Oral Tablet;  Therapy: (Recorded:26Nov2008) to Recorded 2. Isosorbide Mononitrate ER 60 MG Oral Tablet Extended Release 24 Hour;  Therapy: (Recorded:26Nov2008) to Recorded 3. Lipitor 40 MG Oral Tablet;  Therapy: (Recorded:26Nov2008) to Recorded 4. Metoprolol Succinate ER 50 MG Oral Tablet Extended Release 24 Hour;  Therapy: 16Dec2014 to Recorded 5. Ranexa 500 MG Oral Tablet Extended Release 12 Hour;  Therapy: 23Feb2015 to Recorded  Allergies Medication  1. No Known Drug Allergies  Family History Problems  1. Family history of Family Health Status - Father's Age : Maternal Uncle   65 2. Family history of Family Health Status - Mother's Age : Maternal Uncle   63 3. Family history of Heart Disease : Mother 4. Family history of Heart Disease : Maternal Uncle  Social History Problems  1. Activities Of Daily Living 2. Being A Social Drinker 3. Caffeine Use   3 a day 4. Current every day smoker (F17.200) 5. Exercise Habits   Walks and stands a lot at work but no regular exer. 6. Living Independently 7. Occupation:   Designer, industrial/product 8. Self-reliant In  Usual Daily Activities 9. Tobacco Use   1/2 ppd for 32 years  Vitals Vital Signs [Data Includes: Last 1 Day]  Recorded: 21Mar2016 03:33PM  Height: 5 ft 5 in Weight: 188 lb  BMI Calculated: 31.29 BSA Calculated: 1.93 Blood Pressure: 165 / 99 Temperature: 98 F Heart Rate: 61  Results/Data  Urine [Data Includes: Last 1 Day]   21Mar2016  COLOR YELLOW   APPEARANCE CLEAR   SPECIFIC GRAVITY  1.025   pH 5.0   GLUCOSE NEG mg/dL  BILIRUBIN NEG   KETONE NEG mg/dL  BLOOD NEG   PROTEIN NEG mg/dL  UROBILINOGEN 0.2 mg/dL  NITRITE NEG   LEUKOCYTE ESTERASE NEG    Assessment Assessed  1. Urge and stress incontinence (N39.46)  Plan Urge and stress incontinence  1. Follow-up Schedule Surgery Office  Follow-up  Status: Hold For - Appointment   Requested for: 21Mar2016  Discussion/Summary   [name] still has mixed incontinence and mainly stress incontinence. Myrbetriq failed. She would like to proceed with a sling and she remembers the potential complications, etc, but I drew her a picture and went through my usual sling talk.   We talked about a sling in detail. Pros, cons, general surgical and anesthetic risks, and other options including behavioral therapy and watchful waiting were discussed. She understands that slings are generally successful in 90% of cases for stress incontinence, 50% for urge incontinence, and that in a small percentage of cases the incontinence can worsen. The risk of persistent, de novo, or worsening incontinence/dysfunction was discussed. Risks were described but not limited to the discussion of injury to neighboring structures including the bowel (with possible life-threatening sepsis and colostomy), bladder, urethra, vagina (all resulting in further surgery), and ureter (resulting in re-implantation). We also talked about the risk of retention requiring urethrolysis, extrusion requiring revision, and erosion resulting in further surgery. Bleeding risks and transfusion rates and the risk of infection were discussed. The risk of pelvic and abdominal pain syndromes, dyspareunia, and neuropathies were discussed. The need for CIC was described as well as the usual postoperative course. The patient understands that she might not reach her treatment goal and that she might be worse following surgery. Mesh TV issues were discussed.  She understands mesh issues from  TV. I think a repeat sling versus urethral injectables, especially at her age, is more prudent.  She understands that I do not promise that it is going to fix things totally and I hope it does. She understands I will remove the suburethral component of the previous sling, if it looks like it is almost eroded and it is found at the time of surgery. Otherwise, it will be left in situ.  After a thorough review of the management options for the patient's condition the patient  elected to proceed with surgical therapy as noted above. We have discussed the potential benefits and risks of the procedure, side effects of the proposed treatment, the likelihood of the patient achieving the goals of the procedure, and any potential problems that might occur during the procedure or recuperation. Informed consent has been obtained. 

## 2014-06-17 ENCOUNTER — Other Ambulatory Visit: Payer: Self-pay

## 2014-06-17 ENCOUNTER — Ambulatory Visit (HOSPITAL_BASED_OUTPATIENT_CLINIC_OR_DEPARTMENT_OTHER)
Admission: RE | Admit: 2014-06-17 | Discharge: 2014-06-17 | Disposition: A | Discharge status: Home / Self Care | Payer: Managed Care, Other (non HMO) | Source: Ambulatory Visit | Attending: Urology | Admitting: Urology

## 2014-06-17 ENCOUNTER — Ambulatory Visit (HOSPITAL_BASED_OUTPATIENT_CLINIC_OR_DEPARTMENT_OTHER): Payer: Managed Care, Other (non HMO) | Admitting: Anesthesiology

## 2014-06-17 ENCOUNTER — Encounter (HOSPITAL_BASED_OUTPATIENT_CLINIC_OR_DEPARTMENT_OTHER)
Admission: RE | Disposition: A | Discharge status: Home / Self Care | Payer: Self-pay | Source: Ambulatory Visit | Attending: Urology

## 2014-06-17 ENCOUNTER — Encounter (HOSPITAL_BASED_OUTPATIENT_CLINIC_OR_DEPARTMENT_OTHER): Payer: Self-pay

## 2014-06-17 DIAGNOSIS — Z7982 Long term (current) use of aspirin: Secondary | ICD-10-CM | POA: Diagnosis not present

## 2014-06-17 DIAGNOSIS — E780 Pure hypercholesterolemia: Secondary | ICD-10-CM | POA: Insufficient documentation

## 2014-06-17 DIAGNOSIS — Z9049 Acquired absence of other specified parts of digestive tract: Secondary | ICD-10-CM | POA: Insufficient documentation

## 2014-06-17 DIAGNOSIS — Z5309 Procedure and treatment not carried out because of other contraindication: Secondary | ICD-10-CM | POA: Diagnosis not present

## 2014-06-17 DIAGNOSIS — Z90710 Acquired absence of both cervix and uterus: Secondary | ICD-10-CM | POA: Diagnosis not present

## 2014-06-17 DIAGNOSIS — F17210 Nicotine dependence, cigarettes, uncomplicated: Secondary | ICD-10-CM | POA: Diagnosis not present

## 2014-06-17 DIAGNOSIS — I252 Old myocardial infarction: Secondary | ICD-10-CM | POA: Diagnosis not present

## 2014-06-17 DIAGNOSIS — Z955 Presence of coronary angioplasty implant and graft: Secondary | ICD-10-CM | POA: Insufficient documentation

## 2014-06-17 DIAGNOSIS — F329 Major depressive disorder, single episode, unspecified: Secondary | ICD-10-CM | POA: Insufficient documentation

## 2014-06-17 DIAGNOSIS — N3946 Mixed incontinence: Secondary | ICD-10-CM | POA: Insufficient documentation

## 2014-06-17 DIAGNOSIS — I10 Essential (primary) hypertension: Secondary | ICD-10-CM | POA: Diagnosis not present

## 2014-06-17 DIAGNOSIS — I2510 Atherosclerotic heart disease of native coronary artery without angina pectoris: Secondary | ICD-10-CM | POA: Diagnosis not present

## 2014-06-17 HISTORY — DX: Gastro-esophageal reflux disease without esophagitis: K21.9

## 2014-06-17 HISTORY — DX: Family history of ischemic heart disease and other diseases of the circulatory system: Z82.49

## 2014-06-17 HISTORY — DX: Bipolar disorder, unspecified: F31.9

## 2014-06-17 HISTORY — DX: Old myocardial infarction: I25.2

## 2014-06-17 HISTORY — DX: Hyperlipidemia, unspecified: E78.5

## 2014-06-17 HISTORY — DX: Presence of coronary angioplasty implant and graft: Z95.5

## 2014-06-17 HISTORY — DX: Chest pain, unspecified: R07.9

## 2014-06-17 HISTORY — DX: Stress incontinence (female) (male): N39.3

## 2014-06-17 HISTORY — DX: Presence of aortocoronary bypass graft: Z95.1

## 2014-06-17 HISTORY — DX: Atherosclerotic heart disease of native coronary artery without angina pectoris: I25.10

## 2014-06-17 HISTORY — DX: Other chronic pain: G89.29

## 2014-06-17 LAB — CBC
HEMATOCRIT: 39.6 % (ref 36.0–46.0)
Hemoglobin: 13.7 g/dL (ref 12.0–15.0)
MCH: 30.9 pg (ref 26.0–34.0)
MCHC: 34.6 g/dL (ref 30.0–36.0)
MCV: 89.4 fL (ref 78.0–100.0)
Platelets: 248 10*3/uL (ref 150–400)
RBC: 4.43 MIL/uL (ref 3.87–5.11)
RDW: 12.5 % (ref 11.5–15.5)
WBC: 11.4 10*3/uL — ABNORMAL HIGH (ref 4.0–10.5)

## 2014-06-17 LAB — POCT I-STAT, CHEM 8
BUN: 16 mg/dL (ref 6–23)
CALCIUM ION: 1.08 mmol/L — AB (ref 1.12–1.23)
CREATININE: 0.7 mg/dL (ref 0.50–1.10)
Chloride: 103 mmol/L (ref 96–112)
Glucose, Bld: 103 mg/dL — ABNORMAL HIGH (ref 70–99)
HEMATOCRIT: 39 % (ref 36.0–46.0)
HEMOGLOBIN: 13.3 g/dL (ref 12.0–15.0)
Potassium: 2.7 mmol/L — CL (ref 3.5–5.1)
Sodium: 140 mmol/L (ref 135–145)
TCO2: 20 mmol/L (ref 0–100)

## 2014-06-17 LAB — APTT: aPTT: 30 seconds (ref 24–37)

## 2014-06-17 LAB — BASIC METABOLIC PANEL
ANION GAP: 9 (ref 5–15)
BUN: 17 mg/dL (ref 6–23)
CALCIUM: 8.6 mg/dL (ref 8.4–10.5)
CO2: 25 mmol/L (ref 19–32)
Chloride: 104 mmol/L (ref 96–112)
Creatinine, Ser: 0.74 mg/dL (ref 0.50–1.10)
GFR calc Af Amer: >90 mL/min (ref >90)
GFR calc non Af Amer: >90 mL/min (ref >90)
Glucose, Bld: 103 mg/dL — ABNORMAL HIGH (ref 70–99)
Potassium: 2.6 mmol/L — CL (ref 3.5–5.1)
Sodium: 138 mmol/L (ref 135–145)

## 2014-06-17 LAB — TYPE AND SCREEN
ABO/RH(D): B NEG
ANTIBODY SCREEN: NEGATIVE

## 2014-06-17 LAB — PROTIME-INR
INR: 1.05 (ref 0.00–1.49)
Prothrombin Time: 13.8 seconds (ref 11.6–15.2)

## 2014-06-17 SURGERY — CYSTOSCOPY
Anesthesia: General

## 2014-06-17 MED ORDER — LACTATED RINGERS IV SOLN
INTRAVENOUS | Status: DC
  Administered 2014-06-17: 10:00:00 via INTRAVENOUS
  Filled 2014-06-17: qty 1000

## 2014-06-17 MED ORDER — GENTAMICIN SULFATE 40 MG/ML IJ SOLN
5.0000 mg/kg | Freq: Once | INTRAVENOUS | Status: DC
  Filled 2014-06-17: qty 8.5

## 2014-06-17 MED ORDER — PHENAZOPYRIDINE HCL 200 MG PO TABS
200.0000 mg | ORAL_TABLET | Freq: Once | ORAL | Status: AC
  Administered 2014-06-17: 200 mg via ORAL
  Filled 2014-06-17: qty 1

## 2014-06-17 MED ORDER — GENTAMICIN IN SALINE 1.6-0.9 MG/ML-% IV SOLN
80.0000 mg | INTRAVENOUS | Status: DC
  Filled 2014-06-17: qty 50

## 2014-06-17 MED ORDER — AMPICILLIN-SULBACTAM SODIUM 1.5 (1-0.5) G IJ SOLR
INTRAMUSCULAR | Status: AC
  Filled 2014-06-17: qty 1.5

## 2014-06-17 MED ORDER — SODIUM CHLORIDE 0.9 % IV SOLN
1.5000 g | INTRAVENOUS | Status: DC
  Filled 2014-06-17: qty 1.5

## 2014-06-17 MED ORDER — PHENAZOPYRIDINE HCL 100 MG PO TABS
ORAL_TABLET | ORAL | Status: AC
  Filled 2014-06-17: qty 2

## 2014-06-17 SURGICAL SUPPLY — 67 items
ADAPTER CATH URET PLST 4-6FR (CATHETERS) IMPLANT
BAG DRAIN URO-CYSTO SKYTR STRL (DRAIN) IMPLANT
BAG DRN UROCATH (DRAIN)
BAG URINE DRAINAGE (UROLOGICAL SUPPLIES) IMPLANT
BALLN NEPHROSTOMY (BALLOONS)
BALLOON NEPHROSTOMY (BALLOONS) IMPLANT
BLADE CLIPPER SURG (BLADE) IMPLANT
BLADE HEX COATED 2.75 (ELECTRODE) IMPLANT
BLADE SURG 10 STRL SS (BLADE) IMPLANT
BLADE SURG 15 STRL LF DISP TIS (BLADE) IMPLANT
BLADE SURG 15 STRL SS (BLADE)
CANISTER SUCT LVC 12 LTR MEDI- (MISCELLANEOUS) IMPLANT
CANISTER SUCTION 1200CC (MISCELLANEOUS) IMPLANT
CATH FOLEY 2WAY SLVR  5CC 16FR (CATHETERS)
CATH FOLEY 2WAY SLVR 5CC 16FR (CATHETERS) IMPLANT
CATH ROBINSON RED A/P 14FR (CATHETERS) IMPLANT
CATH URET 5FR 28IN CONE TIP (BALLOONS)
CATH URET 5FR 70CM CONE TIP (BALLOONS) IMPLANT
CLOTH BEACON ORANGE TIMEOUT ST (SAFETY) IMPLANT
COVER BACK TABLE 60X90IN (DRAPES) IMPLANT
COVER MAYO STAND STRL (DRAPES) IMPLANT
DRAIN PENROSE 18X1/4 LTX STRL (WOUND CARE) IMPLANT
DRAPE UNDERBUTTOCKS STRL (DRAPE) IMPLANT
ELECT REM PT RETURN 9FT ADLT (ELECTROSURGICAL)
ELECTRODE REM PT RTRN 9FT ADLT (ELECTROSURGICAL) IMPLANT
GAUZE SPONGE 4X4 16PLY XRAY LF (GAUZE/BANDAGES/DRESSINGS) IMPLANT
GLOVE BIO SURGEON STRL SZ7.5 (GLOVE) IMPLANT
GOWN STRL REUS W/ TWL LRG LVL3 (GOWN DISPOSABLE) IMPLANT
GOWN STRL REUS W/ TWL XL LVL3 (GOWN DISPOSABLE) IMPLANT
GOWN STRL REUS W/TWL LRG LVL3 (GOWN DISPOSABLE)
GOWN STRL REUS W/TWL XL LVL3 (GOWN DISPOSABLE)
GUIDEWIRE STR DUAL SENSOR (WIRE) IMPLANT
HOLDER FOLEY CATH W/STRAP (MISCELLANEOUS) IMPLANT
HOOK RETRACTION 12 ELAST STAY (MISCELLANEOUS) IMPLANT
LIQUID BAND (GAUZE/BANDAGES/DRESSINGS) IMPLANT
NDL SAFETY ECLIPSE 18X1.5 (NEEDLE) IMPLANT
NEEDLE HYPO 18GX1.5 SHARP (NEEDLE)
NEEDLE HYPO 22GX1.5 SAFETY (NEEDLE) IMPLANT
NS IRRIG 500ML POUR BTL (IV SOLUTION) IMPLANT
PACK BASIN DAY SURGERY FS (CUSTOM PROCEDURE TRAY) IMPLANT
PACK CYSTO (CUSTOM PROCEDURE TRAY) IMPLANT
PACKING VAGINAL (PACKING) IMPLANT
PENCIL BUTTON HOLSTER BLD 10FT (ELECTRODE) IMPLANT
PLUG CATH AND CAP STER (CATHETERS) IMPLANT
RETRACTOR STERILE 25.8CMX11.3 (INSTRUMENTS) IMPLANT
SET IRRIG Y TYPE TUR BLADDER L (SET/KITS/TRAYS/PACK) IMPLANT
SHEET LAVH (DRAPES) IMPLANT
SURGILUBE 2OZ TUBE FLIPTOP (MISCELLANEOUS) IMPLANT
SUT SILK 2 0 30  PSL (SUTURE)
SUT SILK 2 0 30 PSL (SUTURE) IMPLANT
SUT VIC AB 2-0 CT1 27 (SUTURE)
SUT VIC AB 2-0 CT1 TAPERPNT 27 (SUTURE) IMPLANT
SUT VIC AB 2-0 SH 27 (SUTURE)
SUT VIC AB 2-0 SH 27XBRD (SUTURE) IMPLANT
SUT VICRYL 4-0 PS2 18IN ABS (SUTURE) IMPLANT
SYR 20CC LL (SYRINGE) IMPLANT
SYR BULB IRRIGATION 50ML (SYRINGE) IMPLANT
SYR CONTROL 10ML LL (SYRINGE) IMPLANT
SYRINGE 10CC LL (SYRINGE) IMPLANT
SYRINGE IRR TOOMEY STRL 70CC (SYRINGE) IMPLANT
TOWEL OR 17X24 6PK STRL BLUE (TOWEL DISPOSABLE) IMPLANT
TRAY DSU PREP LF (CUSTOM PROCEDURE TRAY) IMPLANT
TUBE CONNECTING 12'X1/4 (SUCTIONS)
TUBE CONNECTING 12X1/4 (SUCTIONS) IMPLANT
WATER STERILE IRR 3000ML UROMA (IV SOLUTION) IMPLANT
WATER STERILE IRR 500ML POUR (IV SOLUTION) IMPLANT
YANKAUER SUCT BULB TIP NO VENT (SUCTIONS) IMPLANT

## 2014-06-17 NOTE — Progress Notes (Signed)
Lab called with potassium results 2.6, reported to Dr. Leta Jungling.

## 2014-06-17 NOTE — Progress Notes (Signed)
Anesthesia cancelled surgery due to low K and EKG changes  i will call her cardiologist and she will also

## 2014-06-17 NOTE — Anesthesia Preprocedure Evaluation (Deleted)
Anesthesia Evaluation  Patient identified by MRN, date of birth, ID band Patient awake    Reviewed: Allergy & Precautions, H&P , NPO status , Patient's Chart, lab work & pertinent test results, reviewed documented beta blocker date and time   Airway Mallampati: II  TM Distance: >3 FB Neck ROM: full    Dental no notable dental hx. (+) Teeth Intact, Dental Advisory Given   Pulmonary neg pulmonary ROS, Current Smoker,  breath sounds clear to auscultation  Pulmonary exam normal       Cardiovascular Exercise Tolerance: Good hypertension, Pt. on home beta blockers + CAD, + Past MI, + Cardiac Stents and + CABG Rhythm:regular Rate:Normal  Stents x 2 2005. 3 vessel CAD   Neuro/Psych negative neurological ROS  negative psych ROS   GI/Hepatic negative GI ROS, Neg liver ROS,   Endo/Other  negative endocrine ROS  Renal/GU negative Renal ROS  negative genitourinary   Musculoskeletal   Abdominal   Peds  Hematology negative hematology ROS (+)   Anesthesia Other Findings K 2.6 ECG  flat T waves and prolonged QT  Reproductive/Obstetrics negative OB ROS                          Anesthesia Physical Anesthesia Plan  ASA: III  Anesthesia Plan: General   Post-op Pain Management:    Induction: Intravenous  Airway Management Planned: LMA  Additional Equipment:   Intra-op Plan:   Post-operative Plan:   Informed Consent: I have reviewed the patients History and Physical, chart, labs and discussed the procedure including the risks, benefits and alternatives for the proposed anesthesia with the patient or authorized representative who has indicated his/her understanding and acceptance.   Dental Advisory Given  Plan Discussed with: CRNA and Surgeon  Anesthesia Plan Comments:         Anesthesia Quick Evaluation

## 2014-07-01 ENCOUNTER — Ambulatory Visit (HOSPITAL_BASED_OUTPATIENT_CLINIC_OR_DEPARTMENT_OTHER): Payer: Managed Care, Other (non HMO)

## 2014-07-01 HISTORY — PX: TRANSTHORACIC ECHOCARDIOGRAM: SHX275

## 2014-07-21 ENCOUNTER — Other Ambulatory Visit: Payer: Self-pay | Admitting: Urology

## 2014-08-21 NOTE — Progress Notes (Signed)
REVIEWED CHART W/ DR DENENNY MDA, SINCE THIS PT WAS CANCELLED ON 06-17-2014 DOS DUE TO K+ 2.6 AND ST CHANGES ON EKG.  PT WAS RE-CLEARED FOR SURGERY 08-26-2014 FROM CARDIOLOGIST WITH STRESS TEST BEING DONE.  DR DENENNY MDA STATES , OK TO PROCEED.

## 2014-08-22 ENCOUNTER — Encounter (HOSPITAL_BASED_OUTPATIENT_CLINIC_OR_DEPARTMENT_OTHER): Payer: Self-pay | Admitting: *Deleted

## 2014-08-22 NOTE — Progress Notes (Addendum)
NPO AFTER MN.  ARRIVE AT 0938.  NEEDS CBC, BMET, PT/IINR, PTT,  T & S.  CURRENT EKG , ECHO AND  LAST STRESS TEST TO BE FAXED FROM DR PARASCHOS.  WILL TAKE AM MEDS W/  SIPS OF WATER DOS.

## 2014-08-25 NOTE — H&P (Signed)
History of Present Illness   Ms Perovich had a sling in 2011 by myself for mixed stress urge incontinence. The stress component at the time was mild but it bothered her more than the urge component. She had an overactive bladder with sensory urgency and reduced maximum capacity on urodynamics associated with significant leakage. She failed Toviaz, VESIcare, the gel and physical therapy.  After her sling she was dry, but her bladder was still urgent.   In the last 8 months her leaking has worsened. She leaks with coughing and sneezing, sometimes bending and lifting. She has urge incontinence. She says the stress component is more severe. She wears 2 pads a day. They can be moderately wet to soaken. She denies enuresis and gets up twice a night. She had a negative cough test associated with grade 2 hypermobility of the bladder neck. I did not see or feel any sling extrusion. She emptied efficiently. Cystoscopic examination was normal. She is here to discuss her urodynamics.    When she had urodynamics on her last visit in June 2015, she had a negative cough test with a hard cough. I could see some pale mucosa underneath the mid urethra but no extrusion. If she ever had a repeat sling, I thought her first sling might be quite superficial. I would partially remove it at the time of surgery, if it became clinically relevant.   Her leak-point pressure at 200 mL was 95 cmH2O and it was mild. It was 80 cmH2O at 400 mL. Her bladder was stable at a capacity of 500 mL.   I recommended physical therapy and Myrbetriq. I felt approximately 70% of her problem was mild stress incontinence with a mild overactive bladder. We were planning to possibly repeat the sling after physical therapy or even consider urethral injectables. It does not appear she would follow up with a physical therapy team.   Urinalysis: Negative.  Review of Systems: No change in bowel or neurologic systems.   Frequency is stable.    Past  Medical History Problems  1. History of Acute Myocardial Infarction 2. History of cardiac disorder (Z86.79) 3. History of depression (Z86.59) 4. History of hypercholesterolemia (Z86.39) 5. History of hypertension (Z86.79)  Surgical History Problems  1. History of CABG (CABG) 2. History of Cholecystectomy 3. History of Hysterectomy 4. History of Vaginal Sling Operation For Stress Incontinence  Current Meds 1. Aspirin 325 MG Oral Tablet;  Therapy: (Recorded:26Nov2008) to Recorded 2. Isosorbide Mononitrate ER 60 MG Oral Tablet Extended Release 24 Hour;  Therapy: (Recorded:26Nov2008) to Recorded 3. Lipitor 40 MG Oral Tablet;  Therapy: (Recorded:26Nov2008) to Recorded 4. Metoprolol Succinate ER 50 MG Oral Tablet Extended Release 24 Hour;  Therapy: 16Dec2014 to Recorded 5. Ranexa 500 MG Oral Tablet Extended Release 12 Hour;  Therapy: 23Feb2015 to Recorded  Allergies Medication  1. No Known Drug Allergies  Family History Problems  1. Family history of Family Health Status - Father's Age : Maternal Uncle   65 2. Family history of Family Health Status - Mother's Age : Maternal Uncle   63 3. Family history of Heart Disease : Mother 4. Family history of Heart Disease : Maternal Uncle  Social History Problems  1. Activities Of Daily Living 2. Being A Social Drinker 3. Caffeine Use   3 a day 4. Current every day smoker (F17.200) 5. Exercise Habits   Walks and stands a lot at work but no regular exer. 6. Living Independently 7. Occupation:   Designer, industrial/product 8. Self-reliant In  Usual Daily Activities 9. Tobacco Use   1/2 ppd for 32 years  Vitals Vital Signs [Data Includes: Last 1 Day]  Recorded: 21Mar2016 03:33PM  Height: 5 ft 5 in Weight: 188 lb  BMI Calculated: 31.29 BSA Calculated: 1.93 Blood Pressure: 165 / 99 Temperature: 98 F Heart Rate: 61  Results/Data  Urine [Data Includes: Last 1 Day]   21Mar2016  COLOR YELLOW   APPEARANCE CLEAR   SPECIFIC GRAVITY  1.025   pH 5.0   GLUCOSE NEG mg/dL  BILIRUBIN NEG   KETONE NEG mg/dL  BLOOD NEG   PROTEIN NEG mg/dL  UROBILINOGEN 0.2 mg/dL  NITRITE NEG   LEUKOCYTE ESTERASE NEG    Assessment Assessed  1. Urge and stress incontinence (N39.46)  Plan Urge and stress incontinence  1. Follow-up Schedule Surgery Office  Follow-up  Status: Hold For - Appointment   Requested for: 21Mar2016  Discussion/Summary   Ms Dalal still has mixed incontinence and mainly stress incontinence. Myrbetriq failed. She would like to proceed with a sling and she remembers the potential complications, etc, but I drew her a picture and went through my usual sling talk.   We talked about a sling in detail. Pros, cons, general surgical and anesthetic risks, and other options including behavioral therapy and watchful waiting were discussed. She understands that slings are generally successful in 90% of cases for stress incontinence, 50% for urge incontinence, and that in a small percentage of cases the incontinence can worsen. The risk of persistent, de novo, or worsening incontinence/dysfunction was discussed. Risks were described but not limited to the discussion of injury to neighboring structures including the bowel (with possible life-threatening sepsis and colostomy), bladder, urethra, vagina (all resulting in further surgery), and ureter (resulting in re-implantation). We also talked about the risk of retention requiring urethrolysis, extrusion requiring revision, and erosion resulting in further surgery. Bleeding risks and transfusion rates and the risk of infection were discussed. The risk of pelvic and abdominal pain syndromes, dyspareunia, and neuropathies were discussed. The need for CIC was described as well as the usual postoperative course. The patient understands that she might not reach her treatment goal and that she might be worse following surgery. Mesh TV issues were discussed.  She understands mesh issues from  TV. I think a repeat sling versus urethral injectables, especially at her age, is more prudent.  She understands that I do not promise that it is going to fix things totally and I hope it does. She understands I will remove the suburethral component of the previous sling, if it looks like it is almost eroded and it is found at the time of surgery. Otherwise, it will be left in situ.  After a thorough review of the management options for the patient's condition the patient  elected to proceed with surgical therapy as noted above. We have discussed the potential benefits and risks of the procedure, side effects of the proposed treatment, the likelihood of the patient achieving the goals of the procedure, and any potential problems that might occur during the procedure or recuperation. Informed consent has been obtained.

## 2014-08-26 ENCOUNTER — Encounter (HOSPITAL_BASED_OUTPATIENT_CLINIC_OR_DEPARTMENT_OTHER)
Admission: RE | Disposition: A | Discharge status: Home / Self Care | Payer: Self-pay | Source: Ambulatory Visit | Attending: Urology

## 2014-08-26 ENCOUNTER — Encounter (HOSPITAL_BASED_OUTPATIENT_CLINIC_OR_DEPARTMENT_OTHER): Payer: Self-pay

## 2014-08-26 ENCOUNTER — Ambulatory Visit (HOSPITAL_BASED_OUTPATIENT_CLINIC_OR_DEPARTMENT_OTHER): Payer: Managed Care, Other (non HMO) | Admitting: Anesthesiology

## 2014-08-26 ENCOUNTER — Ambulatory Visit (HOSPITAL_BASED_OUTPATIENT_CLINIC_OR_DEPARTMENT_OTHER)
Admission: RE | Admit: 2014-08-26 | Discharge: 2014-08-26 | Disposition: A | Discharge status: Home / Self Care | Payer: Managed Care, Other (non HMO) | Source: Ambulatory Visit | Attending: Urology | Admitting: Urology

## 2014-08-26 DIAGNOSIS — T836XXA Infection and inflammatory reaction due to prosthetic device, implant and graft in genital tract, initial encounter: Secondary | ICD-10-CM | POA: Insufficient documentation

## 2014-08-26 DIAGNOSIS — I252 Old myocardial infarction: Secondary | ICD-10-CM | POA: Insufficient documentation

## 2014-08-26 DIAGNOSIS — N8110 Cystocele, unspecified: Secondary | ICD-10-CM | POA: Diagnosis not present

## 2014-08-26 DIAGNOSIS — Z90710 Acquired absence of both cervix and uterus: Secondary | ICD-10-CM | POA: Insufficient documentation

## 2014-08-26 DIAGNOSIS — F419 Anxiety disorder, unspecified: Secondary | ICD-10-CM | POA: Diagnosis not present

## 2014-08-26 DIAGNOSIS — I10 Essential (primary) hypertension: Secondary | ICD-10-CM | POA: Diagnosis not present

## 2014-08-26 DIAGNOSIS — Z951 Presence of aortocoronary bypass graft: Secondary | ICD-10-CM | POA: Diagnosis not present

## 2014-08-26 DIAGNOSIS — Y763 Surgical instruments, materials and obstetric and gynecological devices (including sutures) associated with adverse incidents: Secondary | ICD-10-CM | POA: Insufficient documentation

## 2014-08-26 DIAGNOSIS — N3946 Mixed incontinence: Secondary | ICD-10-CM | POA: Insufficient documentation

## 2014-08-26 DIAGNOSIS — E780 Pure hypercholesterolemia: Secondary | ICD-10-CM | POA: Insufficient documentation

## 2014-08-26 DIAGNOSIS — Z7982 Long term (current) use of aspirin: Secondary | ICD-10-CM | POA: Diagnosis not present

## 2014-08-26 DIAGNOSIS — A630 Anogenital (venereal) warts: Secondary | ICD-10-CM | POA: Diagnosis not present

## 2014-08-26 HISTORY — PX: REMOVAL OF URINARY SLING: SHX6218

## 2014-08-26 HISTORY — PX: CYSTO: SHX6284

## 2014-08-26 LAB — BASIC METABOLIC PANEL
Anion gap: 10 (ref 5–15)
BUN: 22 mg/dL — ABNORMAL HIGH (ref 6–20)
CALCIUM: 8.9 mg/dL (ref 8.9–10.3)
CO2: 21 mmol/L — AB (ref 22–32)
CREATININE: 0.83 mg/dL (ref 0.44–1.00)
Chloride: 104 mmol/L (ref 101–111)
GFR calc Af Amer: >60 mL/min (ref >60)
GFR calc non Af Amer: >60 mL/min (ref >60)
GLUCOSE: 124 mg/dL — AB (ref 65–99)
Potassium: 4.1 mmol/L (ref 3.5–5.1)
Sodium: 135 mmol/L (ref 135–145)

## 2014-08-26 LAB — PROTIME-INR
INR: 0.99 (ref 0.00–1.49)
PROTHROMBIN TIME: 13.3 s (ref 11.6–15.2)

## 2014-08-26 LAB — APTT: aPTT: 36 seconds (ref 24–37)

## 2014-08-26 LAB — TYPE AND SCREEN
ABO/RH(D): B NEG
ANTIBODY SCREEN: NEGATIVE

## 2014-08-26 LAB — POCT I-STAT, CHEM 8
BUN: 22 mg/dL — AB (ref 6–20)
CALCIUM ION: 1.16 mmol/L (ref 1.12–1.23)
Chloride: 108 mmol/L (ref 101–111)
Creatinine, Ser: 0.8 mg/dL (ref 0.44–1.00)
Glucose, Bld: 125 mg/dL — ABNORMAL HIGH (ref 65–99)
HEMATOCRIT: 42 % (ref 36.0–46.0)
HEMOGLOBIN: 14.3 g/dL (ref 12.0–15.0)
Potassium: 4.1 mmol/L (ref 3.5–5.1)
SODIUM: 138 mmol/L (ref 135–145)
TCO2: 20 mmol/L (ref 0–100)

## 2014-08-26 LAB — CBC
HEMATOCRIT: 42 % (ref 36.0–46.0)
Hemoglobin: 14.2 g/dL (ref 12.0–15.0)
MCH: 31 pg (ref 26.0–34.0)
MCHC: 33.8 g/dL (ref 30.0–36.0)
MCV: 91.7 fL (ref 78.0–100.0)
PLATELETS: 208 10*3/uL (ref 150–400)
RBC: 4.58 MIL/uL (ref 3.87–5.11)
RDW: 12.8 % (ref 11.5–15.5)
WBC: 6.9 10*3/uL (ref 4.0–10.5)

## 2014-08-26 SURGERY — CYSTO
Anesthesia: General | Site: Vagina

## 2014-08-26 MED ORDER — DEXAMETHASONE SODIUM PHOSPHATE 4 MG/ML IJ SOLN
INTRAMUSCULAR | Status: DC | PRN
  Administered 2014-08-26: 10 mg via INTRAVENOUS

## 2014-08-26 MED ORDER — HYDROCODONE-ACETAMINOPHEN 5-325 MG PO TABS: 1.0000 | ORAL_TABLET | Freq: Four times a day (QID) | ORAL | Status: DC | PRN

## 2014-08-26 MED ORDER — OXYCODONE HCL 5 MG/5ML PO SOLN
5.0000 mg | Freq: Once | ORAL | Status: DC | PRN
  Filled 2014-08-26: qty 5

## 2014-08-26 MED ORDER — CEPHALEXIN 500 MG PO CAPS: 500.0000 mg | ORAL_CAPSULE | Freq: Three times a day (TID) | ORAL | Status: DC

## 2014-08-26 MED ORDER — ESTRADIOL 0.1 MG/GM VA CREA
TOPICAL_CREAM | VAGINAL | Status: DC | PRN
Start: 2014-08-26 — End: 2014-08-26
  Administered 2014-08-26: 1 via VAGINAL

## 2014-08-26 MED ORDER — FENTANYL CITRATE (PF) 100 MCG/2ML IJ SOLN
INTRAMUSCULAR | Status: AC
  Filled 2014-08-26: qty 4

## 2014-08-26 MED ORDER — FENTANYL CITRATE (PF) 100 MCG/2ML IJ SOLN
INTRAMUSCULAR | Status: DC | PRN
  Administered 2014-08-26: 50 ug via INTRAVENOUS
  Administered 2014-08-26 (×2): 25 ug via INTRAVENOUS
  Administered 2014-08-26 (×2): 50 ug via INTRAVENOUS

## 2014-08-26 MED ORDER — EPHEDRINE SULFATE 50 MG/ML IJ SOLN
INTRAMUSCULAR | Status: DC | PRN
  Administered 2014-08-26: 15 mg via INTRAVENOUS
  Administered 2014-08-26: 10 mg via INTRAVENOUS

## 2014-08-26 MED ORDER — FENTANYL CITRATE (PF) 100 MCG/2ML IJ SOLN
25.0000 ug | INTRAMUSCULAR | Status: DC | PRN
  Filled 2014-08-26: qty 1

## 2014-08-26 MED ORDER — MIDAZOLAM HCL 5 MG/5ML IJ SOLN
INTRAMUSCULAR | Status: DC | PRN
  Administered 2014-08-26: 2 mg via INTRAVENOUS

## 2014-08-26 MED ORDER — OXYCODONE HCL 5 MG PO TABS
5.0000 mg | ORAL_TABLET | Freq: Once | ORAL | Status: DC | PRN
  Filled 2014-08-26: qty 1

## 2014-08-26 MED ORDER — LIDOCAINE-EPINEPHRINE (PF) 1 %-1:200000 IJ SOLN
INTRAMUSCULAR | Status: DC | PRN
  Administered 2014-08-26: 1 mL

## 2014-08-26 MED ORDER — STERILE WATER FOR IRRIGATION IR SOLN
Status: DC | PRN
  Administered 2014-08-26: 1000 mL

## 2014-08-26 MED ORDER — ONDANSETRON HCL 4 MG/2ML IJ SOLN
INTRAMUSCULAR | Status: DC | PRN
Start: 2014-08-26 — End: 2014-08-26
  Administered 2014-08-26: 4 mg via INTRAVENOUS

## 2014-08-26 MED ORDER — SODIUM CHLORIDE 0.9 % IR SOLN
Status: DC | PRN
  Administered 2014-08-26: 08:00:00

## 2014-08-26 MED ORDER — AMPICILLIN-SULBACTAM SODIUM 1.5 (1-0.5) G IJ SOLR
INTRAMUSCULAR | Status: AC
  Filled 2014-08-26: qty 1.5

## 2014-08-26 MED ORDER — PHENAZOPYRIDINE HCL 200 MG PO TABS
200.0000 mg | ORAL_TABLET | Freq: Once | ORAL | Status: AC
Start: 2014-08-26 — End: 2014-08-26
  Administered 2014-08-26: 200 mg via ORAL
  Filled 2014-08-26: qty 1

## 2014-08-26 MED ORDER — PROPOFOL 10 MG/ML IV BOLUS
INTRAVENOUS | Status: DC | PRN
  Administered 2014-08-26: 30 mg via INTRAVENOUS
  Administered 2014-08-26: 170 mg via INTRAVENOUS

## 2014-08-26 MED ORDER — LIDOCAINE HCL (CARDIAC) 20 MG/ML IV SOLN
INTRAVENOUS | Status: DC | PRN
  Administered 2014-08-26: 50 mg via INTRAVENOUS

## 2014-08-26 MED ORDER — STERILE WATER FOR IRRIGATION IR SOLN
Status: DC | PRN
Start: 2014-08-26 — End: 2014-08-26
  Administered 2014-08-26: 3000 mL via INTRAVESICAL

## 2014-08-26 MED ORDER — ACETAMINOPHEN 10 MG/ML IV SOLN
INTRAVENOUS | Status: DC | PRN
  Administered 2014-08-26: 1000 mg via INTRAVENOUS

## 2014-08-26 MED ORDER — PHENAZOPYRIDINE HCL 100 MG PO TABS
ORAL_TABLET | ORAL | Status: AC
  Filled 2014-08-26: qty 2

## 2014-08-26 MED ORDER — LACTATED RINGERS IV SOLN
INTRAVENOUS | Status: DC
  Administered 2014-08-26 (×2): via INTRAVENOUS
  Filled 2014-08-26: qty 1000

## 2014-08-26 MED ORDER — SODIUM CHLORIDE 0.9 % IV SOLN
1.5000 g | INTRAVENOUS | Status: AC
  Administered 2014-08-26: 1.5 g via INTRAVENOUS
  Filled 2014-08-26: qty 1.5

## 2014-08-26 MED ORDER — ONDANSETRON HCL 4 MG/2ML IJ SOLN
4.0000 mg | Freq: Four times a day (QID) | INTRAMUSCULAR | Status: DC | PRN
  Filled 2014-08-26: qty 2

## 2014-08-26 MED ORDER — GENTAMICIN IN SALINE 1.6-0.9 MG/ML-% IV SOLN
80.0000 mg | INTRAVENOUS | Status: DC
  Administered 2014-08-26: 340 mg via INTRAVENOUS
  Filled 2014-08-26: qty 50

## 2014-08-26 MED ORDER — GENTAMICIN SULFATE 40 MG/ML IJ SOLN
5.0000 mg/kg | Freq: Once | INTRAVENOUS | Status: DC
  Filled 2014-08-26 (×2): qty 8.5

## 2014-08-26 MED ORDER — 0.9 % SODIUM CHLORIDE (POUR BTL) OPTIME
TOPICAL | Status: DC | PRN
  Administered 2014-08-26: 2000 mL

## 2014-08-26 MED ORDER — MIDAZOLAM HCL 2 MG/2ML IJ SOLN
INTRAMUSCULAR | Status: AC
  Filled 2014-08-26: qty 2

## 2014-08-26 SURGICAL SUPPLY — 75 items
BAG DRAIN URO-CYSTO SKYTR STRL (DRAIN) IMPLANT
BAG DRN UROCATH (DRAIN)
BAG URINE DRAINAGE (UROLOGICAL SUPPLIES) IMPLANT
BLADE CLIPPER SURG (BLADE) IMPLANT
BLADE HEX COATED 2.75 (ELECTRODE) ×4 IMPLANT
BLADE SURG 10 STRL SS (BLADE) ×4 IMPLANT
BLADE SURG 15 STRL LF DISP TIS (BLADE) ×3 IMPLANT
BLADE SURG 15 STRL SS (BLADE) ×3
CANISTER SUCT LVC 12 LTR MEDI- (MISCELLANEOUS) IMPLANT
CANISTER SUCTION 1200CC (MISCELLANEOUS) ×8 IMPLANT
CANISTER SUCTION 2500CC (MISCELLANEOUS) ×4 IMPLANT
CATH FOLEY 2WAY SLVR  5CC 16FR (CATHETERS) ×1
CATH FOLEY 2WAY SLVR  5CC 18FR (CATHETERS)
CATH FOLEY 2WAY SLVR 5CC 16FR (CATHETERS) ×3 IMPLANT
CATH FOLEY 2WAY SLVR 5CC 18FR (CATHETERS) IMPLANT
CATH ROBINSON RED A/P 12FR (CATHETERS) IMPLANT
CATH ROBINSON RED A/P 14FR (CATHETERS) IMPLANT
CLOTH BEACON ORANGE TIMEOUT ST (SAFETY) IMPLANT
CONT SPECI 4OZ STER CLIK (MISCELLANEOUS) ×4 IMPLANT
COVER BACK TABLE 60X90IN (DRAPES) ×4 IMPLANT
COVER MAYO STAND STRL (DRAPES) ×8 IMPLANT
DRAIN PENROSE 18X1/4 LTX STRL (WOUND CARE) ×4 IMPLANT
DRAPE UNDERBUTTOCKS STRL (DRAPE) ×4 IMPLANT
ELECT BLADE 6.5 .24CM SHAFT (ELECTRODE) ×4 IMPLANT
ELECT REM PT RETURN 9FT ADLT (ELECTROSURGICAL) ×4
ELECTRODE REM PT RTRN 9FT ADLT (ELECTROSURGICAL) ×3 IMPLANT
GAUZE SPONGE 4X4 16PLY XRAY LF (GAUZE/BANDAGES/DRESSINGS) IMPLANT
GLOVE BIO SURGEON STRL SZ7 (GLOVE) ×8 IMPLANT
GLOVE BIO SURGEON STRL SZ7.5 (GLOVE) ×8 IMPLANT
GLOVE BIOGEL PI IND STRL 7.0 (GLOVE) ×3 IMPLANT
GLOVE BIOGEL PI IND STRL 7.5 (GLOVE) ×3 IMPLANT
GLOVE BIOGEL PI INDICATOR 7.0 (GLOVE) ×1
GLOVE BIOGEL PI INDICATOR 7.5 (GLOVE) ×1
GLOVE INDICATOR 7.5 STRL GRN (GLOVE) ×4 IMPLANT
GLOVE LITE  25/BX (GLOVE) ×4 IMPLANT
GLOVE SURG SS PI 7.5 STRL IVOR (GLOVE) ×4 IMPLANT
GOWN STRL REUS W/ TWL LRG LVL3 (GOWN DISPOSABLE) ×6 IMPLANT
GOWN STRL REUS W/ TWL XL LVL3 (GOWN DISPOSABLE) ×3 IMPLANT
GOWN STRL REUS W/TWL LRG LVL3 (GOWN DISPOSABLE) ×8
GOWN STRL REUS W/TWL XL LVL3 (GOWN DISPOSABLE) ×4
HOLDER FOLEY CATH W/STRAP (MISCELLANEOUS) IMPLANT
HOOK RETRACTION 12 ELAST STAY (MISCELLANEOUS) ×4 IMPLANT
LIQUID BAND (GAUZE/BANDAGES/DRESSINGS) IMPLANT
MANIFOLD NEPTUNE II (INSTRUMENTS) IMPLANT
NDL SAFETY ECLIPSE 18X1.5 (NEEDLE) IMPLANT
NEEDLE HYPO 18GX1.5 SHARP (NEEDLE)
NEEDLE HYPO 22GX1.5 SAFETY (NEEDLE) ×4 IMPLANT
NS IRRIG 500ML POUR BTL (IV SOLUTION) IMPLANT
PACK BASIN DAY SURGERY FS (CUSTOM PROCEDURE TRAY) ×4 IMPLANT
PACK CYSTO (CUSTOM PROCEDURE TRAY) ×8 IMPLANT
PACKING VAGINAL (PACKING) ×4 IMPLANT
PENCIL BUTTON HOLSTER BLD 10FT (ELECTRODE) ×4 IMPLANT
PLUG CATH AND CAP STER (CATHETERS) ×4 IMPLANT
RETRACTOR STERILE 25.8CMX11.3 (INSTRUMENTS) ×4 IMPLANT
SET IRRIG Y TYPE TUR BLADDER L (SET/KITS/TRAYS/PACK) ×4 IMPLANT
SHEET LAVH (DRAPES) ×4 IMPLANT
SURGILUBE 2OZ TUBE FLIPTOP (MISCELLANEOUS) ×4 IMPLANT
SUT SILK 0 TIES 10X30 (SUTURE) IMPLANT
SUT SILK 2 0 30  PSL (SUTURE)
SUT SILK 2 0 30 PSL (SUTURE) IMPLANT
SUT VIC AB 2-0 CT1 27 (SUTURE) ×6
SUT VIC AB 2-0 CT1 TAPERPNT 27 (SUTURE) ×6 IMPLANT
SUT VIC AB 2-0 SH 27 (SUTURE)
SUT VIC AB 2-0 SH 27XBRD (SUTURE) IMPLANT
SUT VICRYL 4-0 PS2 18IN ABS (SUTURE) ×4 IMPLANT
SYR 20CC LL (SYRINGE) IMPLANT
SYR BULB IRRIGATION 50ML (SYRINGE) ×4 IMPLANT
SYR CONTROL 10ML LL (SYRINGE) ×4 IMPLANT
SYRINGE 10CC LL (SYRINGE) ×4 IMPLANT
TOWEL OR 17X24 6PK STRL BLUE (TOWEL DISPOSABLE) ×4 IMPLANT
TRAY DSU PREP LF (CUSTOM PROCEDURE TRAY) ×4 IMPLANT
TUBE CONNECTING 12X1/4 (SUCTIONS) ×8 IMPLANT
WATER STERILE IRR 3000ML UROMA (IV SOLUTION) IMPLANT
WATER STERILE IRR 500ML POUR (IV SOLUTION) IMPLANT
YANKAUER SUCT BULB TIP NO VENT (SUCTIONS) ×4 IMPLANT

## 2014-08-26 NOTE — Op Note (Signed)
Preoperative diagnosis: Stress urinary incontinence Postoperative diagnosis: Stress urinary incontinence and infection of suburethral sling and vaginal lesions Surgery: Removal of infected suburethral sling and cystoscopy and Biopsy of vaginal wall lesions Surgeon: Dr. Lorin Picket Avie Checo Assistant: Dr. Loura Pardon  The patient has the above diagnoses and consented to the above procedure. Preoperatively I did mention to the patient once again that her sling may be more superficial just underneath the vaginal wall mucosa and if I find it I would remove the suburethral component and place the new sling  The patient was prepped and draped in the usual fashion and area extra care was taken to minimize the risk of compartment syndrome and neuropathy and deep vein thrombosis  On initial inspection she had a small grade 2 or grade 1 cystocele and mild narrowing of the vagina. I marked my 2 incisions over the symphysis pubis but did not make them until I inspected the vagina  With a little bit of traction using a flat DeBakey a little pocket of fluid approximately 2 mL or less extruded from the vaginal wall mucosa just to the left of the midline in the mid urethra. There was not a dimple associated with it. I took anaerobic and aerobic cultures and sent them. With a culture swab itself in my opinion I developed the sinus to expose sling. I believe there was a layer of vaginal wall mucosa that was thin overlying the sling that was in a sinus track. I actually could see sling a few millimeters at this stage. I told the team we would not be replacing the sling at this stage  Using my double-ring retractor I had appropriate exposure. I decided not to use lidocaine epinephrine mixture. I marked and made a 3 cm suburethral incision. I made it an appropriate depth. I passed a right angle through the sinus over the exposed sling into the midline incision and remove the vaginal wall mucsoa a few millimeters of tissue  between the 2.  It was easy to see the sling and it was not markedly ingrown with tissue suggesting that there was a low-grade infection associated with it. The tissue looked healthy  With the appropriate exposure and retraction I sharply and bluntly dissected the sling to the endopelvic fascia bilaterally after incising it in the midline. This was done after easily passing a small right angle underneath the sling in the midline.  The sling was cut at the level of the endopelvic fascia bilaterally.  On further inspection there were on the patient's right side it a few more millimeters of sling associated with embedded soft tissue also removed to the same level.  Visually and palpably there was no exposed sling and the urethra looked healthy.  Approxi-1 L of saline and irrigation was utilized. Hemostasis was excellent.  I cystoscoped the patient. She had a cystocele. There was no sling in the bladder dome. Urethra looked healthy. There was reflux of yellow urine bilaterally.  In the vaginal vault at the interface of the left lateral wall and posterior wall she had 3 almost warty-looking friable lesions each approximately a centimeter by a half centimeter in size. These were easily biopsied and sent to pathology. They almost could be pulled off with DeBakeys. One could argue there was a few millimeters of ulcerist tissue underneath them but basically they were being taking off the mucosa. They did not extend into the vaginal wall. I fulgurated the base is each  I closed the anterior vaginal wall with running  2-0 Vicryl on a CT1 needle. Vaginal pack was placed. She was not left with a Foley catheter  The patient will be kept on any biotics and in my opinion is a candidate for repeat sling and approxi-4 months. She'll be told that postprocedure her stress and/or urge incontinence could worsen

## 2014-08-26 NOTE — Discharge Instructions (Signed)
I have reviewed discharge instructions in detail with the patient. They will follow-up with me or their physician as scheduled. My nurse will also be calling the patients as per protocol.  ° ° ° °CYSTOSCOPY HOME CARE INSTRUCTIONS ° °Activity: °Rest for the remainder of the day.  Do not drive or operate equipment today.  You may resume normal activities in one to two days as instructed by your physician.  ° °Meals: °Drink plenty of liquids and eat light foods such as gelatin or soup this evening.  You may return to a normal meal plan tomorrow. ° °Return to Work: °You may return to work in one to two days or as instructed by your physician. ° °Special Instructions / Symptoms: °Call your physician if any of these symptoms occur: ° ° -persistent or heavy bleeding ° -bleeding which continues after first few urination ° -large blood clots that are difficult to pass ° -urine stream diminishes or stops completely ° -fever equal to or higher than 101 degrees Farenheit. ° -cloudy urine with a strong, foul odor ° -severe pain ° °Females should always wipe from front to back after elimination.  You may feel some burning pain when you urinate.  This should disappear with time.  Applying moist heat to the lower abdomen or a hot tub bath may help relieve the pain. \ ° °Follow-Up / Date of Return Visit to Your Physician:  °Call for an appointment to arrange follow-up. ° ° ° ° °Post Anesthesia Home Care Instructions ° °Activity: °Get plenty of rest for the remainder of the day. A responsible adult should stay with you for 24 hours following the procedure.  °For the next 24 hours, DO NOT: °-Drive a car °-Operate machinery °-Drink alcoholic beverages °-Take any medication unless instructed by your physician °-Make any legal decisions or sign important papers. ° °Meals: °Start with liquid foods such as gelatin or soup. Progress to regular foods as tolerated. Avoid greasy, spicy, heavy foods. If nausea and/or vomiting occur, drink only  clear liquids until the nausea and/or vomiting subsides. Call your physician if vomiting continues. ° °Special Instructions/Symptoms: °Your throat may feel dry or sore from the anesthesia or the breathing tube placed in your throat during surgery. If this causes discomfort, gargle with warm salt water. The discomfort should disappear within 24 hours. ° °If you had a scopolamine patch placed behind your ear for the management of post- operative nausea and/or vomiting: ° °1. The medication in the patch is effective for 72 hours, after which it should be removed.  Wrap patch in a tissue and discard in the trash. Wash hands thoroughly with soap and water. °2. You may remove the patch earlier than 72 hours if you experience unpleasant side effects which may include dry mouth, dizziness or visual disturbances. °3. Avoid touching the patch. Wash your hands with soap and water after contact with the patch. °  ° °

## 2014-08-26 NOTE — Anesthesia Postprocedure Evaluation (Signed)
Anesthesia Post Note  Patient: [name]  Procedure(s) Performed: Procedure(s) (LRB): CYSTO (N/A) REMOVAL OF URINARY INFECTED SLING. (N/A)  Anesthesia type: General  Patient location: PACU  Post pain: Pain level controlled and Adequate analgesia  Post assessment: Post-op Vital signs reviewed, Patient's Cardiovascular Status Stable, Respiratory Function Stable, Patent Airway and Pain level controlled  Last Vitals:  Filed Vitals:   08/26/14 1025  BP: 119/79  Pulse: 65  Temp: 36.4 C  Resp: 16    Post vital signs: Reviewed and stable  Level of consciousness: awake, alert  and oriented  Complications: No apparent anesthesia complications

## 2014-08-26 NOTE — Anesthesia Procedure Notes (Signed)
Procedure Name: LMA Insertion Date/Time: 08/26/2014 7:35 AM Performed by: Tyrone Nine Pre-anesthesia Checklist: Patient identified, Emergency Drugs available, Suction available, Patient being monitored and Timeout performed Patient Re-evaluated:Patient Re-evaluated prior to inductionOxygen Delivery Method: Circle system utilized Preoxygenation: Pre-oxygenation with 100% oxygen Intubation Type: IV induction Ventilation: Mask ventilation without difficulty LMA: LMA inserted LMA Size: 4.0 Number of attempts: 1 Airway Equipment and Method: Bite block Placement Confirmation: positive ETCO2 and breath sounds checked- equal and bilateral Tube secured with: Tape Dental Injury: Teeth and Oropharynx as per pre-operative assessment

## 2014-08-26 NOTE — Interval H&P Note (Signed)
History and Physical Interval Note:  08/26/2014 7:08 AM  [name]  has presented today for surgery, with the diagnosis of STRESS INCONTINENT  The various methods of treatment have been discussed with the patient and family. After consideration of risks, benefits and other options for treatment, the patient has consented to  Procedure(s): CYSTO (N/A) PUBO-VAGINAL SLING (N/A) as a surgical intervention .  The patient's history has been reviewed, patient examined, no change in status, stable for surgery.  I have reviewed the patient's chart and labs.  Questions were answered to the patient's satisfaction.     Hayat Warbington A

## 2014-08-26 NOTE — Transfer of Care (Signed)
Immediate Anesthesia Transfer of Care Note  Patient: [name]  Procedure(s) Performed: Procedure(s): CYSTO (N/A) REMOVAL OF URINARY INFECTED SLING. (N/A)  Patient Location: PACU  Anesthesia Type:General  Level of Consciousness: awake, alert , oriented and patient cooperative  Airway & Oxygen Therapy: Patient Spontanous Breathing and Patient connected to nasal cannula oxygen  Post-op Assessment: Report given to RN and Post -op Vital signs reviewed and stable  Post vital signs: Reviewed and stable  Last Vitals:  Filed Vitals:   08/26/14 0551  BP: 131/75  Pulse: 65  Temp: 37.1 C  Resp: 16    Complications: No apparent anesthesia complications

## 2014-08-26 NOTE — Anesthesia Preprocedure Evaluation (Addendum)
Anesthesia Evaluation  Patient identified by MRN, date of birth, ID band Patient awake    Reviewed: Allergy & Precautions, NPO status , Patient's Chart, lab work & pertinent test results  Airway Mallampati: II  TM Distance: >3 FB Neck ROM: full    Dental  (+) Teeth Intact, Chipped, Dental Advisory Given,    Pulmonary Current Smoker,  breath sounds clear to auscultation        Cardiovascular hypertension, Pt. on medications and Pt. on home beta blockers + CAD, + Cardiac Stents and + CABG Rhythm:regular Rate:Normal  Cleared by her cardiologist.   Neuro/Psych Bipolar Disorder    GI/Hepatic GERD-  Medicated and Controlled,  Endo/Other  obese  Renal/GU      Musculoskeletal   Abdominal   Peds  Hematology   Anesthesia Other Findings   Reproductive/Obstetrics                           Anesthesia Physical Anesthesia Plan  ASA: III  Anesthesia Plan: General   Post-op Pain Management:    Induction: Intravenous  Airway Management Planned: LMA  Additional Equipment:   Intra-op Plan:   Post-operative Plan:   Informed Consent: I have reviewed the patients History and Physical, chart, labs and discussed the procedure including the risks, benefits and alternatives for the proposed anesthesia with the patient or authorized representative who has indicated his/her understanding and acceptance.     Plan Discussed with: CRNA, Anesthesiologist and Surgeon  Anesthesia Plan Comments:         Anesthesia Quick Evaluation

## 2014-08-27 ENCOUNTER — Encounter (HOSPITAL_BASED_OUTPATIENT_CLINIC_OR_DEPARTMENT_OTHER): Payer: Self-pay | Admitting: Urology

## 2014-08-28 LAB — CULTURE, ROUTINE-ABSCESS
Culture: NO GROWTH
GRAM STAIN: NONE SEEN

## 2014-08-31 LAB — ANAEROBIC CULTURE: Gram Stain: NONE SEEN

## 2014-12-26 ENCOUNTER — Ambulatory Visit (INDEPENDENT_AMBULATORY_CARE_PROVIDER_SITE_OTHER): Payer: Managed Care, Other (non HMO) | Admitting: Emergency Medicine

## 2014-12-26 VITALS — BP 142/84 | HR 68 | Temp 99.0°F | Resp 16 | Ht 66.0 in | Wt 190.0 lb

## 2014-12-26 DIAGNOSIS — J209 Acute bronchitis, unspecified: Secondary | ICD-10-CM | POA: Diagnosis not present

## 2014-12-26 DIAGNOSIS — J0140 Acute pansinusitis, unspecified: Secondary | ICD-10-CM | POA: Diagnosis not present

## 2014-12-26 MED ORDER — HYDROCOD POLST-CPM POLST ER 10-8 MG/5ML PO SUER: 5.0000 mL | Freq: Two times a day (BID) | ORAL | Status: DC

## 2014-12-26 MED ORDER — PSEUDOEPHEDRINE-GUAIFENESIN ER 60-600 MG PO TB12: 1.0000 | ORAL_TABLET | Freq: Two times a day (BID) | ORAL | Status: AC

## 2014-12-26 MED ORDER — AMOXICILLIN-POT CLAVULANATE 875-125 MG PO TABS: 1.0000 | ORAL_TABLET | Freq: Two times a day (BID) | ORAL | Status: DC

## 2014-12-26 NOTE — Patient Instructions (Signed)

## 2014-12-26 NOTE — Progress Notes (Signed)
Subjective:  Patient ID: [name], female    DOB: [birth year]  Age: 49 y.o. MRN: [id number]  CC: Cough; Nasal Congestion; and Generalized Body Aches   HPI [name] presents  with nasal congestion postnasal drainage pressure cheeks and forehead. She has purulent nasal drainage. She has a cough productive of purulent sputum. She has no fever or chills. She does have wheeze and has a history of reactive airway disease which responds to albuterol inhaler. She's been using her MDI with some improvement of her cough. She has no shortness of breath nausea vomiting or other complaints. She has no improvement with over-the-counter medication  History [name] has a past medical history of Hypertension; History of MI (myocardial infarction); S/P CABG x 4; S/P drug eluting coronary stent placement; GERD (gastroesophageal reflux disease); Bipolar 1 disorder (HCC); SUI (stress urinary incontinence, female); Family history of premature CAD; Coronary artery disease; 3-vessel CAD; Hyperlipidemia; and Chronic chest pain.   She has past surgical history that includes Laparoscopic cholecystectomy (04-04-2002); Vaginal hysterectomy (10-07-2003); EXCISION LEFT ARM MASS (02-05-2007); CYSTOURETHROPEXY/  SPARC  SLING (06-25-2009); RESECTION VULVAR CONDYLOMATA AND LASER ABLATION VULVA AND VAGINAL CONDYLOMATA (05-06-2010); Coronary artery bypass graft (09-10-2002   dr Tressie Stalker); Coronary angioplasty with stent (oct 2005   [facility]); Cardiac catheterization (08-31-2006  dr hochrein); Cardiac catheterization (11-19-2009  dr Darrold Junker [facility]); Cardiovascular stress test (07-01-2014  dr Lyn Hollingshead [facility]); transthoracic echocardiogram (07-01-2014); Cysto (N/A, 08/26/2014); and Removal of urinary sling (N/A, 08/26/2014).   Her  family history includes Cancer in her brother; Heart disease in her maternal grandmother and mother; Hyperlipidemia in her mother; Hypertension in her mother.  She   reports that she  has been smoking Cigarettes.  She has a 37 pack-year smoking history. She has never used smokeless tobacco. She reports that she drinks alcohol. She reports that she does not use illicit drugs.  Outpatient Prescriptions Prior to Visit  Medication Sig Dispense Refill  . albuterol (PROVENTIL HFA;VENTOLIN HFA) 108 (90 BASE) MCG/ACT inhaler Inhale 2 puffs into the lungs every 4 (four) hours as needed for wheezing or shortness of breath. 1 Inhaler 0  . atorvastatin (LIPITOR) 40 MG tablet Take 40 mg by mouth every morning.     . calcium carbonate (TUMS - DOSED IN MG ELEMENTAL CALCIUM) 500 MG chewable tablet Chew 1 tablet by mouth as needed for indigestion or heartburn.    . estradiol (ESTRACE) 0.5 MG tablet Take 0.5 mg by mouth 2 (two) times daily.    Marland Kitchen FLUoxetine (PROZAC) 20 MG tablet Take 20 mg by mouth every morning.    Marland Kitchen HYDROcodone-acetaminophen (NORCO) 5-325 MG per tablet Take 1-2 tablets by mouth every 6 (six) hours as needed for moderate pain. 30 tablet 0  . isosorbide mononitrate (IMDUR) 60 MG 24 hr tablet Take 60 mg by mouth 2 (two) times daily.     . metoprolol succinate (TOPROL-XL) 50 MG 24 hr tablet Take 50 mg by mouth every morning. Take with or immediately following a meal.    . nitroGLYCERIN (NITROSTAT) 0.4 MG SL tablet Place 0.4 mg under the tongue every 5 (five) minutes as needed for chest pain.    . potassium chloride SA (KLOR-CON M20) 20 MEQ tablet Take 20 mEq by mouth every morning.    . ranolazine (RANEXA) 1000 MG SR tablet Take 1,000 mg by mouth 2 (two) times daily.    . cephALEXin (KEFLEX) 500 MG capsule Take 1 capsule (500 mg total) by mouth 3 (three) times daily. 21  capsule 0   No facility-administered medications prior to visit.    Social History   Social History  . Marital Status: Significant Other    Spouse Name: N/A  . Number of Children: N/A  . Years of Education: N/A   Social History Main Topics  . Smoking status: Current Every Day Smoker -- 1.00 packs/day for  37 years    Types: Cigarettes  . Smokeless tobacco: Never Used  . Alcohol Use: Yes     Comment: RARE  . Drug Use: No  . Sexual Activity: Not Asked   Other Topics Concern  . None   Social History Narrative     Review of Systems  Constitutional: Negative for fever, chills and appetite change.  HENT: Positive for congestion, postnasal drip, rhinorrhea, sinus pressure and sore throat. Negative for ear pain.   Eyes: Negative for pain and redness.  Respiratory: Positive for cough and wheezing. Negative for shortness of breath.   Cardiovascular: Negative for leg swelling.  Gastrointestinal: Negative for nausea, vomiting, abdominal pain, diarrhea, constipation and blood in stool.  Endocrine: Negative for polyuria.  Genitourinary: Negative for dysuria, urgency, frequency and flank pain.  Musculoskeletal: Negative for gait problem.  Skin: Negative for rash.  Neurological: Negative for weakness and headaches.  Psychiatric/Behavioral: Negative for confusion and decreased concentration. The patient is not nervous/anxious.     Objective:  BP 142/84 mmHg  Pulse 68  Temp(Src) 99 F (37.2 C)  Resp 16  Ht  (1.676 m)  Wt 190 lb (86.183 kg)  BMI 30.68 kg/m2  Physical Exam  Constitutional: She is oriented to person, place, and time. She appears well-developed and well-nourished. No distress.  HENT:  Head: Normocephalic and atraumatic.  Right Ear: External ear normal.  Left Ear: External ear normal.  Nose: Nose normal.  Eyes: Conjunctivae and EOM are normal. Pupils are equal, round, and reactive to light. No scleral icterus.  Neck: Normal range of motion. Neck supple. No tracheal deviation present.  Cardiovascular: Normal rate, regular rhythm and normal heart sounds.   Pulmonary/Chest: Effort normal. No respiratory distress. She has wheezes. She has no rales.  Abdominal: She exhibits no mass. There is no tenderness. There is no rebound and no guarding.  Musculoskeletal: She  exhibits no edema.  Lymphadenopathy:    She has no cervical adenopathy.  Neurological: She is alert and oriented to person, place, and time. Coordination normal.  Skin: Skin is warm and dry. No rash noted.  Psychiatric: She has a normal mood and affect. Her behavior is normal.      Assessment & Plan:   [name] was seen today for cough, nasal congestion and generalized body aches.  Diagnoses and all orders for this visit:  Acute bronchitis, unspecified organism  Acute pansinusitis, recurrence not specified  Other orders -     amoxicillin-clavulanate (AUGMENTIN) 875-125 MG tablet; Take 1 tablet by mouth 2 (two) times daily. -     pseudoephedrine-guaifenesin (MUCINEX D) 60-600 MG 12 hr tablet; Take 1 tablet by mouth every 12 (twelve) hours. -     chlorpheniramine-HYDROcodone (TUSSIONEX PENNKINETIC ER) 10-8 MG/5ML SUER; Take 5 mLs by mouth 2 (two) times daily.  I have discontinued Ms. Delauder's cephALEXin. I am also having her start on amoxicillin-clavulanate, pseudoephedrine-guaifenesin, and chlorpheniramine-HYDROcodone. Additionally, I am having her maintain her metoprolol succinate, atorvastatin, isosorbide mononitrate, ranolazine, FLUoxetine, estradiol, nitroGLYCERIN, calcium carbonate, albuterol, potassium chloride SA, HYDROcodone-acetaminophen, and Pseudoeph-Doxylamine-DM-APAP (NYQUIL PO).  Meds ordered this encounter  Medications  . Pseudoeph-Doxylamine-DM-APAP (NYQUIL PO)  Sig: Take by mouth.  Marland Kitchen amoxicillin-clavulanate (AUGMENTIN) 875-125 MG tablet    Sig: Take 1 tablet by mouth 2 (two) times daily.    Dispense:  20 tablet    Refill:  0  . pseudoephedrine-guaifenesin (MUCINEX D) 60-600 MG 12 hr tablet    Sig: Take 1 tablet by mouth every 12 (twelve) hours.    Dispense:  18 tablet    Refill:  0  . chlorpheniramine-HYDROcodone (TUSSIONEX PENNKINETIC ER) 10-8 MG/5ML SUER    Sig: Take 5 mLs by mouth 2 (two) times daily.    Dispense:  60 mL    Refill:  0    Appropriate  red flag conditions were discussed with the patient as well as actions that should be taken.  Patient expressed his understanding.  Follow-up: Return if symptoms worsen or fail to improve.  Carmelina Dane, MD

## 2015-06-30 ENCOUNTER — Ambulatory Visit (INDEPENDENT_AMBULATORY_CARE_PROVIDER_SITE_OTHER): Payer: Managed Care, Other (non HMO) | Admitting: Emergency Medicine

## 2015-06-30 VITALS — BP 124/78 | HR 70 | Temp 98.2°F | Resp 16 | Ht 66.0 in | Wt 197.0 lb

## 2015-06-30 DIAGNOSIS — R05 Cough: Secondary | ICD-10-CM

## 2015-06-30 DIAGNOSIS — J45909 Unspecified asthma, uncomplicated: Secondary | ICD-10-CM | POA: Insufficient documentation

## 2015-06-30 DIAGNOSIS — J4520 Mild intermittent asthma, uncomplicated: Secondary | ICD-10-CM | POA: Diagnosis not present

## 2015-06-30 DIAGNOSIS — J310 Chronic rhinitis: Secondary | ICD-10-CM

## 2015-06-30 DIAGNOSIS — T485X5A Adverse effect of other anti-common-cold drugs, initial encounter: Secondary | ICD-10-CM

## 2015-06-30 DIAGNOSIS — J0140 Acute pansinusitis, unspecified: Secondary | ICD-10-CM

## 2015-06-30 DIAGNOSIS — T485X1A Poisoning by other anti-common-cold drugs, accidental (unintentional), initial encounter: Secondary | ICD-10-CM | POA: Diagnosis not present

## 2015-06-30 DIAGNOSIS — R059 Cough, unspecified: Secondary | ICD-10-CM

## 2015-06-30 MED ORDER — AZELASTINE HCL 0.1 % NA SOLN: 2.0000 | Freq: Two times a day (BID) | NASAL | Status: DC

## 2015-06-30 MED ORDER — AMOXICILLIN-POT CLAVULANATE 875-125 MG PO TABS
1.0000 | ORAL_TABLET | Freq: Two times a day (BID) | ORAL | Status: DC
Start: 2015-06-30 — End: 2015-10-05

## 2015-06-30 MED ORDER — HYDROCOD POLST-CPM POLST ER 10-8 MG/5ML PO SUER: ORAL | Status: DC

## 2015-06-30 MED ORDER — PREDNISONE 10 MG PO TABS: 10.0000 mg | ORAL_TABLET | Freq: Every day | ORAL | Status: DC

## 2015-06-30 NOTE — Progress Notes (Signed)
By signing my name below, I, [name], attest that this documentation has been prepared under the direction and in the presence of [name], MD.  Electronically Signed: Andrew Au, ED Scribe. 06/30/2015. 9:41 AM.   Chief Complaint:  Chief Complaint  Patient presents with  . Sinus Problem    HPI: [name] is a 50 y.o. female who reports to [facility] today complaining of sinus problem. Pt states symptoms started 4 days ago with sneezing. She states symptoms worsened 2 days ago with sinus pressure, sinus congestion with yellow drainage, cough, upper teeth pain as well as upper gum line pain. She has been using vicks sinex, flonase and sudafed without relief to symptoms. She has albuterol inhaler at home. She reports sick contacts at home.       Past Medical History  Diagnosis Date  . Hypertension   . History of MI (myocardial infarction)   . S/P CABG x 4     09-10-2002  . S/P drug eluting coronary stent placement     X2   in 2005  . GERD (gastroesophageal reflux disease)   . Bipolar 1 disorder (HCC)   . SUI (stress urinary incontinence, female)   . Family history of premature CAD   . Coronary artery disease   . 3-vessel CAD     cardiologist -  dr Ladona Mow [facility])  . Hyperlipidemia   . Chronic chest pain     controlled w/ meds and prn nitro   Past Surgical History  Procedure Laterality Date  . Laparoscopic cholecystectomy  04-04-2002  . Vaginal hysterectomy  10-07-2003  . Excision left arm mass  02-05-2007  . Cystourethropexy/  sparc  sling  06-25-2009  . 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 Coronary artery bypass graft  09-10-2002   dr Marilu Favre [place]    x4--  LIMA to LAD,  RIMA to OM2, SVG to PDA and POSTEROLATERAL  . Coronary angioplasty with stent placement  oct 2005   [facility]    post CABG--  DES x2 to SVG to RCA for PDA and Posterolateral occulsion  . Cardiac catheterization  08-31-2006  dr  hochrein    Severe native three-vessel CAD/  Occluded vein graft to the  RCA,  LM normal,  RIMA to OM and LIMA to LAD widely patent/  slightly reduced ef with regional wall motion abnormality,  ef 55% with mild inferior hypokinesis/  continued aggressive medical management  . Cardiac catheterization  11-19-2009  dr Darrold Junker [facility]    Patent LIMA to LAD, patent RIMA to OM1 with occluded native vessel, occluded RCA with occluded SVG to PDA. Collaterals from LAD to PDA and OM1 present/  diaphragmatic akinesis,  ef 55%  . Cardiovascular stress test  07-01-2014  dr Lyn Hollingshead paraschos    lexus scan sestambi study--  LVEF 36%,  mild inferior scar, mild to moderate lateral wall scar without ischemia  . Transthoracic echocardiogram  07-01-2014    LVEF 50%,  mild MR and TR  . Cysto N/A 08/26/2014    Procedure: CYSTO;  Surgeon: Alfredo Martinez, MD;  Location: [facility];  Service: Urology;  Laterality: N/A;  . Removal of urinary sling N/A 08/26/2014    Procedure: REMOVAL OF URINARY INFECTED SLING.;  Surgeon: Alfredo Martinez, MD;  Location: [facility];  Service: Urology;  Laterality: N/A;   Social History   Social History  . Marital Status: Significant Other  Spouse Name: N/A  . Number of Children: N/A  . Years of Education: N/A   Social History Main Topics  . Smoking status: Current Every Day Smoker -- 1.00 packs/day for 37 years    Types: Cigarettes  . Smokeless tobacco: Never Used  . Alcohol Use: Yes     Comment: RARE  . Drug Use: No  . Sexual Activity: Not Asked   Other Topics Concern  . None   Social History Narrative   Family History  Problem Relation Age of Onset  . Heart disease Mother   . Hyperlipidemia Mother   . Hypertension Mother   . Heart disease Maternal Grandmother   . Cancer Brother    No Known Allergies Prior to Admission medications   Medication Sig Start Date End Date Taking? Authorizing Provider  albuterol (PROVENTIL  HFA;VENTOLIN HFA) 108 (90 BASE) MCG/ACT inhaler Inhale 2 puffs into the lungs every 4 (four) hours as needed for wheezing or shortness of breath. 06/13/14  Yes Shon Baton, MD  atorvastatin (LIPITOR) 40 MG tablet Take 40 mg by mouth every morning.    Yes Historical Provider, MD  calcium carbonate (TUMS - DOSED IN MG ELEMENTAL CALCIUM) 500 MG chewable tablet Chew 1 tablet by mouth as needed for indigestion or heartburn.   Yes Historical Provider, MD  estradiol (ESTRACE) 0.5 MG tablet Take 0.5 mg by mouth 2 (two) times daily.   Yes Historical Provider, MD  FLUoxetine (PROZAC) 20 MG tablet Take 20 mg by mouth every morning.   Yes Historical Provider, MD  isosorbide mononitrate (IMDUR) 60 MG 24 hr tablet Take 60 mg by mouth 2 (two) times daily.    Yes Historical Provider, MD  metoprolol succinate (TOPROL-XL) 50 MG 24 hr tablet Take 50 mg by mouth every morning. Take with or immediately following a meal.   Yes Historical Provider, MD  nitroGLYCERIN (NITROSTAT) 0.4 MG SL tablet Place 0.4 mg under the tongue every 5 (five) minutes as needed for chest pain.   Yes Historical Provider, MD  phenylephrine (SUDAFED PE) 10 MG TABS tablet Take 10 mg by mouth every 4 (four) hours as needed.   Yes Historical Provider, MD  potassium chloride SA (KLOR-CON M20) 20 MEQ tablet Take 20 mEq by mouth every morning.   Yes Historical Provider, MD  Pseudoeph-Doxylamine-DM-APAP (NYQUIL PO) Take by mouth.   Yes Historical Provider, MD  pseudoephedrine-guaifenesin (MUCINEX D) 60-600 MG 12 hr tablet Take 1 tablet by mouth every 12 (twelve) hours. 12/26/14 12/26/15 Yes Carmelina Dane, MD  ranolazine (RANEXA) 1000 MG SR tablet Take 1,000 mg by mouth 2 (two) times daily.   Yes Historical Provider, MD  amoxicillin-clavulanate (AUGMENTIN) 875-125 MG tablet Take 1 tablet by mouth 2 (two) times daily. Patient not taking: Reported on 06/30/2015 12/26/14   Carmelina Dane, MD  chlorpheniramine-HYDROcodone [facility] ER)  10-8 MG/5ML SUER Take 5 mLs by mouth 2 (two) times daily. Patient not taking: Reported on 06/30/2015 12/26/14   Carmelina Dane, MD  HYDROcodone-acetaminophen [facility]) 5-325 MG per tablet Take 1-2 tablets by mouth every 6 (six) hours as needed for moderate pain. Patient not taking: Reported on 06/30/2015 08/26/14   Alfredo Martinez, MD     ROS: The patient denies fevers, chills, night sweats, unintentional weight loss, chest pain, palpitations, wheezing, dyspnea on exertion, nausea, vomiting, abdominal pain, dysuria, hematuria, melena, numbness, weakness, or tingling.   All other systems have been reviewed and were otherwise negative with the exception of those mentioned in the  HPI and as above.    PHYSICAL EXAM: Filed Vitals:   06/30/15 0931  BP: 124/78  Pulse: 70  Temp: 98.2 F (36.8 C)  Resp: 16   Body mass index is 31.81 kg/(m^2).   General: Alert, no acute distress HEENT:  Normocephalic, atraumatic, oropharynx patent. Significant nasal obstruction with blockage. Eye: Nonie Hoyer [facility] Cardiovascular:  Regular rate and rhythm, no rubs murmurs or gallops.  No Carotid bruits, radial pulse intact. No pedal edema.  Respiratory: Clear to auscultation bilaterally.  No rales or rhonchi.  No cyanosis, no use of accessory musculature. Bilateral in-expiratory wheeze.  Abdominal: No organomegaly, abdomen is soft and non-tender, positive bowel sounds.  No masses. Musculoskeletal: Gait intact. No edema, tenderness Skin: No rashes. Neurologic: Facial musculature symmetric. Psychiatric: Patient acts appropriately throughout our interaction. Lymphatic: No cervical or submandibular lymphadenopathy   ASSESSMENT/PLAN: I suspect this started as allergies-she has sinus infection. This has also triggered her asthma. She is using her albuterol inhaler for this. I also feel she probably has become to her decongestant nasal spray. Patient placed on Augmentin as well as Astepro nasal spray. She has her  albuterol at home. She will take [place] next at night for cough. She was also placed on a prednisone taper to help get her off of her Vicks nasal spray.I personally performed the services described in this documentation, which was scribed in my presence. The recorded information has been reviewed and is accurate.   Gross sideeffects, risk and benefits, and alternatives of medications d/w patient. Patient is aware that all medications have potential sideeffects and we are unable to predict every sideeffect or drug-drug interaction that may occur.  [name] MD 06/30/2015 9:37 AM

## 2015-06-30 NOTE — Patient Instructions (Addendum)
Sinusitis, Adult Sinusitis is redness, soreness, and inflammation of the paranasal sinuses. Paranasal sinuses are air pockets within the bones of your face. They are located beneath your eyes, in the middle of your forehead, and above your eyes. In healthy paranasal sinuses, mucus is able to drain out, and air is able to circulate through them by way of your nose. However, when your paranasal sinuses are inflamed, mucus and air can become trapped. This can allow bacteria and other germs to grow and cause infection. Sinusitis can develop quickly and last only a short time (acute) or continue over a long period (chronic). Sinusitis that lasts for more than 12 weeks is considered chronic. CAUSES Causes of sinusitis include:  Allergies.  Structural abnormalities, such as displacement of the cartilage that separates your nostrils (deviated septum), which can decrease the air flow through your nose and sinuses and affect sinus drainage.  Functional abnormalities, such as when the small hairs (cilia) that line your sinuses and help remove mucus do not work properly or are not present. SIGNS AND SYMPTOMS Symptoms of acute and chronic sinusitis are the same. The primary symptoms are pain and pressure around the affected sinuses. Other symptoms include:  Upper toothache.  Earache.  Headache.  Bad breath.  Decreased sense of smell and taste.  A cough, which worsens when you are lying flat.  Fatigue.  Fever.  Thick drainage from your nose, which often is green and may contain pus (purulent).  Swelling and warmth over the affected sinuses. DIAGNOSIS Your health care provider will perform a physical exam. During your exam, your health care provider may perform any of the following to help determine if you have acute sinusitis or chronic sinusitis:  Look in your nose for signs of abnormal growths in your nostrils (nasal polyps).  Tap over the affected sinus to check for signs of  infection.  View the inside of your sinuses using an imaging device that has a light attached (endoscope). If your health care provider suspects that you have chronic sinusitis, one or more of the following tests may be recommended:  Allergy tests.  Nasal culture. A sample of mucus is taken from your nose, sent to a lab, and screened for bacteria.  Nasal cytology. A sample of mucus is taken from your nose and examined by your health care provider to determine if your sinusitis is related to an allergy. TREATMENT Most cases of acute sinusitis are related to a viral infection and will resolve on their own within 10 days. Sometimes, medicines are prescribed to help relieve symptoms of both acute and chronic sinusitis. These may include pain medicines, decongestants, nasal steroid sprays, or saline sprays. However, for sinusitis related to a bacterial infection, your health care provider will prescribe antibiotic medicines. These are medicines that will help kill the bacteria causing the infection. Rarely, sinusitis is caused by a fungal infection. In these cases, your health care provider will prescribe antifungal medicine. For some cases of chronic sinusitis, surgery is needed. Generally, these are cases in which sinusitis recurs more than 3 times per year, despite other treatments. HOME CARE INSTRUCTIONS  Drink plenty of water. Water helps thin the mucus so your sinuses can drain more easily.  Use a humidifier.  Inhale steam 3-4 times a day (for example, sit in the bathroom with the shower running).  Apply a warm, moist washcloth to your face 3-4 times a day, or as directed by your health care provider.  Use saline nasal sprays to help   moisten and clean your sinuses.  Take medicines only as directed by your health care provider.  If you were prescribed either an antibiotic or antifungal medicine, finish it all even if you start to feel better. SEEK IMMEDIATE MEDICAL CARE IF:  You have  increasing pain or severe headaches.  You have nausea, vomiting, or drowsiness.  You have swelling around your face.  You have vision problems.  You have a stiff neck.  You have difficulty breathing.   This information is not intended to replace advice given to you by your health care provider. Make sure you discuss any questions you have with your health care provider.   Document Released: 02/14/2005 Document Revised: 03/07/2014 Document Reviewed: 03/01/2011 Elsevier Interactive Patient Education 2016 [employer].  sinusitis

## 2015-08-12 ENCOUNTER — Other Ambulatory Visit: Payer: Self-pay | Admitting: Urology

## 2015-10-05 NOTE — Patient Instructions (Addendum)
[name]  10/05/2015   Your procedure is scheduled on: 10-13-15  Report to [facility] Main  Entrance take [facility]  elevators to 3rd floor to  Short Stay Center at 800 AM.  Call this number if you have problems the morning of surgery [phone]   Remember: ONLY 1 PERSON MAY GO WITH YOU TO SHORT STAY TO GET  READY MORNING OF YOUR SURGERY.  Do not eat food or drink liquids :After Midnight.  BRING CPAP MASK AND TUBING   Take these medicines the morning of surgery with A SIP OF WATER: ALBUTEROL INHALER IF NEEDED AND BRING INHALER WITH YOU, ATORVASTATIN (LIPITOR),  RANOLAZONE (RANEXA), CHANTIX, METORPOLOL SUCCINATE, ISOSORBIDE MONONITRATE                               You may not have any metal on your body including hair pins and              piercings  Do not wear jewelry, make-up, lotions, powders or perfumes, deodorant             Do not wear nail polish.  Do not shave  48 hours prior to surgery.                Do not bring valuables to the hospital. [facility] IS NOT             RESPONSIBLE   FOR VALUABLES.  Contacts, dentures or bridgework may not be worn into surgery.  Leave suitcase in the car. After surgery it may be brought to your room.     Patients discharged the day of surgery will not be allowed to drive home.  Name and phone number of your driver:MARILYN [facility] [employer] [phone]  Special Instructions: N/A              Please read over the following fact sheets you were given: _____________________________________________________________________             [facility] - Preparing for Surgery Before surgery, you can play an important role.  Because skin is not sterile, your skin needs to be as free of germs as possible.  You can reduce the number of germs on your skin by washing with CHG (chlorahexidine gluconate) soap before surgery.  CHG is an antiseptic cleaner which kills germs and bonds with the skin to continue killing germs  even after washing. Please DO NOT use if you have an allergy to CHG or antibacterial soaps.  If your skin becomes reddened/irritated stop using the CHG and inform your nurse when you arrive at Short Stay. Do not shave (including legs and underarms) for at least 48 hours prior to the first CHG shower.  You may shave your face/neck. Please follow these instructions carefully:  1.  Shower with CHG Soap the night before surgery and the  morning of Surgery.  2.  If you choose to wash your hair, wash your hair first as usual with your  normal  shampoo.  3.  After you shampoo, rinse your hair and body thoroughly to remove the  shampoo.                           4.  Use CHG as you would any other liquid soap.  You can apply  chg directly  to the skin and wash                       Gently with a scrungie or clean washcloth.  5.  Apply the CHG Soap to your body ONLY FROM THE NECK DOWN.   Do not use on face/ open                           Wound or open sores. Avoid contact with eyes, ears mouth and genitals (private parts).                       Wash face,  Genitals (private parts) with your normal soap.             6.  Wash thoroughly, paying special attention to the area where your surgery  will be performed.  7.  Thoroughly rinse your body with warm water from the neck down.  8.  DO NOT shower/wash with your normal soap after using and rinsing off  the CHG Soap.                9.  Pat yourself dry with a clean towel.            10.  Wear clean pajamas.            11.  Place clean sheets on your bed the night of your first shower and do not  sleep with pets. Day of Surgery : Do not apply any lotions/deodorants the morning of surgery.  Please wear clean clothes to the hospital/surgery center.  FAILURE TO FOLLOW THESE INSTRUCTIONS MAY RESULT IN THE CANCELLATION OF YOUR SURGERY PATIENT SIGNATURE_________________________________  NURSE  SIGNATURE__________________________________  ________________________________________________________________________

## 2015-10-06 ENCOUNTER — Encounter (HOSPITAL_COMMUNITY)
Admission: RE | Admit: 2015-10-06 | Discharge: 2015-10-06 | Disposition: A | Discharge status: Home / Self Care | Payer: Managed Care, Other (non HMO) | Source: Ambulatory Visit | Attending: Urology | Admitting: Urology

## 2015-10-06 ENCOUNTER — Encounter (HOSPITAL_COMMUNITY): Payer: Self-pay

## 2015-10-06 DIAGNOSIS — N393 Stress incontinence (female) (male): Secondary | ICD-10-CM | POA: Diagnosis not present

## 2015-10-06 DIAGNOSIS — R079 Chest pain, unspecified: Secondary | ICD-10-CM | POA: Diagnosis not present

## 2015-10-06 DIAGNOSIS — Z01812 Encounter for preprocedural laboratory examination: Secondary | ICD-10-CM | POA: Insufficient documentation

## 2015-10-06 DIAGNOSIS — Z01810 Encounter for preprocedural cardiovascular examination: Secondary | ICD-10-CM | POA: Diagnosis present

## 2015-10-06 HISTORY — DX: Sleep apnea, unspecified: G47.30

## 2015-10-06 LAB — APTT: APTT: 35 s (ref 24–36)

## 2015-10-06 LAB — PROTIME-INR
INR: 1.05
PROTHROMBIN TIME: 13.7 s (ref 11.4–15.2)

## 2015-10-06 LAB — BASIC METABOLIC PANEL
Anion gap: 7 (ref 5–15)
BUN: 15 mg/dL (ref 6–20)
CALCIUM: 9.5 mg/dL (ref 8.9–10.3)
CHLORIDE: 104 mmol/L (ref 101–111)
CO2: 27 mmol/L (ref 22–32)
CREATININE: 0.88 mg/dL (ref 0.44–1.00)
GFR calc Af Amer: >60 mL/min (ref >60)
GFR calc non Af Amer: >60 mL/min (ref >60)
Glucose, Bld: 97 mg/dL (ref 65–99)
Potassium: 3.5 mmol/L (ref 3.5–5.1)
SODIUM: 138 mmol/L (ref 135–145)

## 2015-10-06 LAB — CBC
HEMATOCRIT: 42.3 % (ref 36.0–46.0)
HEMOGLOBIN: 15 g/dL (ref 12.0–15.0)
MCH: 31.4 pg (ref 26.0–34.0)
MCHC: 35.5 g/dL (ref 30.0–36.0)
MCV: 88.5 fL (ref 78.0–100.0)
Platelets: 201 10*3/uL (ref 150–400)
RBC: 4.78 MIL/uL (ref 3.87–5.11)
RDW: 12 % (ref 11.5–15.5)
WBC: 7.5 10*3/uL (ref 4.0–10.5)

## 2015-10-06 NOTE — Progress Notes (Signed)
REPEAT EKG DONE PRE OP 10-06-15 DUE TO RECENT CHEST PAIN

## 2015-10-12 MED ORDER — GENTAMICIN SULFATE 40 MG/ML IJ SOLN
360.0000 mg | INTRAVENOUS | Status: DC
  Filled 2015-10-12: qty 9

## 2015-10-12 NOTE — H&P (Signed)
History of Present Illness [name] had a sling in 2011 by myself for mixed stress urge incontinence. The stress component at the time was mild but it bothered her more than the urge component. She had an overactive bladder with sensory urgency and reduced maximum capacity on urodynamics associated with significant leakage. She failed Toviaz, VESIcare, the gel and physical therapy. After her sling she was dry, but her bladder was still urgent.[name] has had her sling removed. She had mild hypermobility of the bladder neck when last examined. There was 1 area at 6 o'clock where there was an indentation, but I thought it was within normal limits. I had gone back to replace the sling in June 2016 recognizing that her initial sling was quite superficial, but there was a small pocket of fluid that turned out to be sterile. I removed the suburethral component of her sling. Urethral injectables had been considered.  On [name]'s last visit, her worsening incontinence was noted. She has had boils on her perineum and has never had MRSA. Her bladder capacity was stable at 700 mL. Her Valsalva leak point pressure was 94 cmH2O with mild leakage. I felt that she had an overactive bladder with a mild to moderate outlet abnormality. I gave her Myrbetriq. She had hydradenitis and not MRSA.  Frequency: Stable.  Urinalysis: Negative. Past Medical History Problems  1. History of Acute Myocardial Infarction 2. History of cardiac disorder (Z86.79) 3. History of depression (Z86.59) 4. History of hypercholesterolemia (Z86.39) 5. History of hypertension (Z86.79) Surgical History Problems  1. History of Biopsy Vaginal Extensive 2. History of CABG 3. History of Cholecystectomy 4. History of Hysterectomy 5. History of Revision And Removal Of Prosthetic Vaginal Graft Vaginal Approach 6. History of Vaginal Sling Operation For Stress Incontinence Current Meds 1. Aspirin 325 MG Oral Tablet; Therapy:  (Recorded:26Nov2008) to Recorded 2. Chantix TABS; Therapy: (Recorded:09Mar2017) to Recorded 3. FLUoxetine HCl - 20 MG Oral Capsule; Therapy: (Recorded:09Mar2017) to Recorded 4. Isosorbide Mononitrate ER 60 MG Oral Tablet Extended Release 24 Hour; Therapy: (Recorded:26Nov2008) to Recorded 5. Lipitor 40 MG Oral Tablet; Therapy: (Recorded:26Nov2008) to Recorded 6. Metoprolol Succinate ER 50 MG Oral Tablet Extended Release 24 Hour; Therapy: 16Dec2014 to Recorded 7. Ranexa 500 MG Oral Tablet Extended Release 12 Hour; Therapy: 23Feb2015 to Recorded Allergies Medication  1. No Known Drug Allergies Family History Problems  1. Family history of Family Health Status - Father's Age : Maternal Uncle  65 2. Family history of Family Health Status - Mother's Age : Maternal Uncle  63 3. Family history of Heart Disease : Mother 4. Family history of Heart Disease : Maternal Uncle Social History Problems  1. Activities Of Daily Living 2. Being A Social Drinker 3. Caffeine Use  3 a day 4. Current every day smoker (F17.200) 5. Exercise Habits  Walks and stands a lot at work but no regular exer. 6. Living Independently 7. Occupation:  Designer, industrial/productLab tech 8. Self-reliant In Usual Daily Activities 9. Tobacco Use  1/2 ppd for 32 years Vitals Vital Signs [Data Includes: Last 1 Day]  Recorded: 03May2017 09:10AM  Blood Pressure: 154 / 96 Temperature: 98.5 F Heart Rate: 76 Results/Data  Urine [Data Includes: Last 1 Day]   03May2017  COLOR YELLOW   APPEARANCE CLEAR   SPECIFIC GRAVITY <1.005   pH 5.0   GLUCOSE NEGATIVE   BILIRUBIN NEGATIVE   KETONE NEGATIVE   BLOOD NEGATIVE   PROTEIN NEGATIVE   NITRITE NEGATIVE   LEUKOCYTE ESTERASE NEGATIVE   Assessment  Assessed  1. Urge and stress incontinence (N39.46) Plan Urge and stress incontinence  1. Follow-up Schedule Surgery Office Follow-up Status: Hold For - Appointment  Requested for: 03May2017 Discussion/Summary [name] did not respond to  the medicine at all. We are going to proceed with a synthetic sling. We walked through things briefly today. We are both on the same page, and hopefully this will greatly help her. Again, the fluid showed no white blood cells or organisms. I think in my hands it is a better procedure than the pubovaginal sling.   We talked about a sling in detail.  Pros, cons, general surgical and anesthetic risks, and other options including behavioral therapy and watchful waiting were discussed.  She understands that slings are generally successful in 90% of cases for stress incontinence, 50% for urge incontinence, and that in a small % of cases the incontinence can worsen. The risk of persistent, de novo, or worsening incontinence/dysfunction was discussed.  Risks were described but not limited to the discussion of injury to neighboring structures including the bowel (with possible life-threatening sepsis and colostomy), bladder, urethra, vagina (all resulting in further surgery), and ureter (resulting in re-implantation).  We also talked about the risk of retention requiring urethrolysis, extrusion requiring revision, and erosion resulting in further surgery.  Bleeding risks and transfusion rates and the risk of infection were discussed.  The risk of pelvic and abdominal pain syndromes, dyspareunia, and neuropathies were discussed.  The need for CIC was described as well the usual post-operative course. The patient understands that she might not reach her treatment goal and that she might be worse following surgery.  After a thorough review of the management options for the patient's condition the patient  elected to proceed with surgical therapy as noted above. We have discussed the potential benefits and risks of the procedure, side effects of the proposed treatment, the likelihood of the patient achieving the goals of the procedure, and any potential problems that might occur during the procedure or recuperation.  Informed consent has been obtained.

## 2015-10-12 NOTE — H&P (Signed)
This is a note that is redundant and not necessary: H and P already done

## 2015-10-13 ENCOUNTER — Ambulatory Visit (HOSPITAL_COMMUNITY): Payer: Managed Care, Other (non HMO) | Admitting: Certified Registered Nurse Anesthetist

## 2015-10-13 ENCOUNTER — Ambulatory Visit (HOSPITAL_COMMUNITY)
Admission: RE | Admit: 2015-10-13 | Discharge: 2015-10-13 | Disposition: A | Discharge status: Home / Self Care | Payer: Managed Care, Other (non HMO) | Source: Ambulatory Visit | Attending: Urology | Admitting: Urology

## 2015-10-13 ENCOUNTER — Encounter (HOSPITAL_COMMUNITY): Payer: Self-pay | Admitting: *Deleted

## 2015-10-13 ENCOUNTER — Encounter (HOSPITAL_COMMUNITY)
Admission: RE | Disposition: A | Discharge status: Home / Self Care | Payer: Self-pay | Source: Ambulatory Visit | Attending: Urology

## 2015-10-13 DIAGNOSIS — I252 Old myocardial infarction: Secondary | ICD-10-CM | POA: Diagnosis not present

## 2015-10-13 DIAGNOSIS — G4730 Sleep apnea, unspecified: Secondary | ICD-10-CM | POA: Diagnosis not present

## 2015-10-13 DIAGNOSIS — I2510 Atherosclerotic heart disease of native coronary artery without angina pectoris: Secondary | ICD-10-CM | POA: Diagnosis not present

## 2015-10-13 DIAGNOSIS — Z7982 Long term (current) use of aspirin: Secondary | ICD-10-CM | POA: Insufficient documentation

## 2015-10-13 DIAGNOSIS — Z79899 Other long term (current) drug therapy: Secondary | ICD-10-CM | POA: Insufficient documentation

## 2015-10-13 DIAGNOSIS — N3946 Mixed incontinence: Secondary | ICD-10-CM | POA: Diagnosis not present

## 2015-10-13 DIAGNOSIS — J45909 Unspecified asthma, uncomplicated: Secondary | ICD-10-CM | POA: Insufficient documentation

## 2015-10-13 DIAGNOSIS — Z90710 Acquired absence of both cervix and uterus: Secondary | ICD-10-CM | POA: Diagnosis not present

## 2015-10-13 DIAGNOSIS — E7800 Pure hypercholesterolemia, unspecified: Secondary | ICD-10-CM | POA: Insufficient documentation

## 2015-10-13 DIAGNOSIS — I10 Essential (primary) hypertension: Secondary | ICD-10-CM | POA: Diagnosis not present

## 2015-10-13 DIAGNOSIS — Z951 Presence of aortocoronary bypass graft: Secondary | ICD-10-CM | POA: Diagnosis not present

## 2015-10-13 DIAGNOSIS — F17210 Nicotine dependence, cigarettes, uncomplicated: Secondary | ICD-10-CM | POA: Diagnosis not present

## 2015-10-13 DIAGNOSIS — N828 Other female genital tract fistulae: Secondary | ICD-10-CM | POA: Insufficient documentation

## 2015-10-13 DIAGNOSIS — K219 Gastro-esophageal reflux disease without esophagitis: Secondary | ICD-10-CM | POA: Diagnosis not present

## 2015-10-13 HISTORY — PX: CYSTOSCOPY: SHX5120

## 2015-10-13 HISTORY — PX: PUBOVAGINAL SLING: SHX1035

## 2015-10-13 LAB — TYPE AND SCREEN
ABO/RH(D): B NEG
Antibody Screen: NEGATIVE

## 2015-10-13 SURGERY — CYSTOSCOPY
Anesthesia: General

## 2015-10-13 MED ORDER — ESTRADIOL 0.1 MG/GM VA CREA
TOPICAL_CREAM | VAGINAL | Status: DC | PRN
  Administered 2015-10-13: 1 via VAGINAL

## 2015-10-13 MED ORDER — CEFAZOLIN SODIUM-DEXTROSE 2-4 GM/100ML-% IV SOLN
2.0000 g | INTRAVENOUS | Status: AC
  Administered 2015-10-13: 2 g via INTRAVENOUS

## 2015-10-13 MED ORDER — FENTANYL CITRATE (PF) 100 MCG/2ML IJ SOLN
INTRAMUSCULAR | Status: AC
  Filled 2015-10-13: qty 2

## 2015-10-13 MED ORDER — PROPOFOL 10 MG/ML IV BOLUS
INTRAVENOUS | Status: DC | PRN
  Administered 2015-10-13: 200 mg via INTRAVENOUS

## 2015-10-13 MED ORDER — BELLADONNA ALKALOIDS-OPIUM 16.2-60 MG RE SUPP
RECTAL | Status: AC
  Filled 2015-10-13: qty 1

## 2015-10-13 MED ORDER — LIDOCAINE HCL 2 % EX GEL
CUTANEOUS | Status: AC
  Filled 2015-10-13: qty 5

## 2015-10-13 MED ORDER — SODIUM CHLORIDE 0.9 % IR SOLN
Status: AC
  Filled 2015-10-13: qty 1

## 2015-10-13 MED ORDER — MEPERIDINE HCL 50 MG/ML IJ SOLN: 6.2500 mg | INTRAMUSCULAR | Status: DC | PRN

## 2015-10-13 MED ORDER — ONDANSETRON HCL 4 MG/2ML IJ SOLN
INTRAMUSCULAR | Status: AC
  Filled 2015-10-13: qty 2

## 2015-10-13 MED ORDER — LIDOCAINE HCL 1 % IJ SOLN
INTRAMUSCULAR | Status: DC | PRN
  Administered 2015-10-13: 60 mg via INTRADERMAL

## 2015-10-13 MED ORDER — PHENAZOPYRIDINE HCL 200 MG PO TABS
200.0000 mg | ORAL_TABLET | Freq: Once | ORAL | Status: AC
  Administered 2015-10-13: 200 mg via ORAL
  Filled 2015-10-13: qty 1

## 2015-10-13 MED ORDER — LACTATED RINGERS IV SOLN: INTRAVENOUS | Status: DC

## 2015-10-13 MED ORDER — DEXAMETHASONE SODIUM PHOSPHATE 10 MG/ML IJ SOLN
INTRAMUSCULAR | Status: DC | PRN
  Administered 2015-10-13: 10 mg via INTRAVENOUS

## 2015-10-13 MED ORDER — ONDANSETRON HCL 4 MG/2ML IJ SOLN
INTRAMUSCULAR | Status: DC | PRN
Start: 2015-10-13 — End: 2015-10-13
  Administered 2015-10-13: 4 mg via INTRAVENOUS

## 2015-10-13 MED ORDER — LIDOCAINE-EPINEPHRINE 1 %-1:100000 IJ SOLN
INTRAMUSCULAR | Status: AC
  Filled 2015-10-13: qty 1

## 2015-10-13 MED ORDER — MIDAZOLAM HCL 2 MG/2ML IJ SOLN
INTRAMUSCULAR | Status: AC
  Filled 2015-10-13: qty 2

## 2015-10-13 MED ORDER — MIDAZOLAM HCL 5 MG/5ML IJ SOLN
INTRAMUSCULAR | Status: DC | PRN
  Administered 2015-10-13: 2 mg via INTRAVENOUS

## 2015-10-13 MED ORDER — PROPOFOL 10 MG/ML IV BOLUS
INTRAVENOUS | Status: AC
  Filled 2015-10-13: qty 20

## 2015-10-13 MED ORDER — FENTANYL CITRATE (PF) 100 MCG/2ML IJ SOLN: 25.0000 ug | INTRAMUSCULAR | Status: DC | PRN

## 2015-10-13 MED ORDER — BACITRACIN 50000 UNITS IM SOLR
INTRAMUSCULAR | Status: DC | PRN
  Administered 2015-10-13: 500 mL

## 2015-10-13 MED ORDER — STERILE WATER FOR IRRIGATION IR SOLN
Status: DC | PRN
  Administered 2015-10-13: 500 mL

## 2015-10-13 MED ORDER — CEFAZOLIN SODIUM-DEXTROSE 2-4 GM/100ML-% IV SOLN
INTRAVENOUS | Status: AC
  Filled 2015-10-13: qty 100

## 2015-10-13 MED ORDER — ROCURONIUM BROMIDE 100 MG/10ML IV SOLN
INTRAVENOUS | Status: AC
  Filled 2015-10-13: qty 1

## 2015-10-13 MED ORDER — FENTANYL CITRATE (PF) 100 MCG/2ML IJ SOLN
INTRAMUSCULAR | Status: DC | PRN
  Administered 2015-10-13: 25 ug via INTRAVENOUS
  Administered 2015-10-13: 50 ug via INTRAVENOUS
  Administered 2015-10-13 (×2): 25 ug via INTRAVENOUS
  Administered 2015-10-13: 50 ug via INTRAVENOUS

## 2015-10-13 MED ORDER — PROMETHAZINE HCL 25 MG/ML IJ SOLN: 6.2500 mg | INTRAMUSCULAR | Status: DC | PRN

## 2015-10-13 MED ORDER — HYDROCODONE-ACETAMINOPHEN 5-325 MG PO TABS: 1.0000 | ORAL_TABLET | ORAL | 0 refills | Status: DC | PRN

## 2015-10-13 MED ORDER — METOCLOPRAMIDE HCL 5 MG/ML IJ SOLN
INTRAMUSCULAR | Status: AC
  Filled 2015-10-13: qty 2

## 2015-10-13 MED ORDER — LIDOCAINE HCL (CARDIAC) 20 MG/ML IV SOLN
INTRAVENOUS | Status: AC
  Filled 2015-10-13: qty 5

## 2015-10-13 MED ORDER — DEXAMETHASONE SODIUM PHOSPHATE 10 MG/ML IJ SOLN
INTRAMUSCULAR | Status: AC
  Filled 2015-10-13: qty 1

## 2015-10-13 MED ORDER — LACTATED RINGERS IV SOLN
INTRAVENOUS | Status: DC
  Administered 2015-10-13 (×2): via INTRAVENOUS

## 2015-10-13 MED ORDER — ESTRADIOL 0.1 MG/GM VA CREA
TOPICAL_CREAM | VAGINAL | Status: AC
  Filled 2015-10-13: qty 42.5

## 2015-10-13 SURGICAL SUPPLY — 48 items
BAG URINE DRAINAGE (UROLOGICAL SUPPLIES) ×4 IMPLANT
BAG URO CATCHER STRL LF (MISCELLANEOUS) ×4 IMPLANT
BLADE HEX COATED 2.75 (ELECTRODE) ×4 IMPLANT
BLADE SURG 15 STRL LF DISP TIS (BLADE) ×2 IMPLANT
BLADE SURG 15 STRL SS (BLADE) ×3
CATH FOLEY 2WAY SLVR  5CC 14FR (CATHETERS) ×2
CATH FOLEY 2WAY SLVR 5CC 14FR (CATHETERS) ×2 IMPLANT
CLOTH BEACON ORANGE TIMEOUT ST (SAFETY) ×4 IMPLANT
COVER MAYO STAND STRL (DRAPES) ×4 IMPLANT
DECANTER SPIKE VIAL GLASS SM (MISCELLANEOUS) IMPLANT
DRAIN PENROSE 18X1/4 LTX STRL (WOUND CARE) ×4 IMPLANT
DRAPE SHEET LG 3/4 BI-LAMINATE (DRAPES) ×4 IMPLANT
ELECT PENCIL ROCKER SW 15FT (MISCELLANEOUS) ×4 IMPLANT
GAUZE PACKING 2X5 YD STRL (GAUZE/BANDAGES/DRESSINGS) ×4 IMPLANT
GAUZE SPONGE 4X4 16PLY XRAY LF (GAUZE/BANDAGES/DRESSINGS) ×8 IMPLANT
GLOVE BIOGEL M STRL SZ7.5 (GLOVE) ×12 IMPLANT
GLOVE ECLIPSE 8.0 STRL XLNG CF (GLOVE) ×8 IMPLANT
GOWN STRL REUS W/TWL XL LVL3 (GOWN DISPOSABLE) ×8 IMPLANT
HOLDER FOLEY CATH W/STRAP (MISCELLANEOUS) ×4 IMPLANT
KIT BASIN OR (CUSTOM PROCEDURE TRAY) ×4 IMPLANT
LIQUID BAND (GAUZE/BANDAGES/DRESSINGS) ×4 IMPLANT
NEEDLE HYPO 22GX1.5 SAFETY (NEEDLE) IMPLANT
NEEDLE MAYO 6 CRC TAPER PT (NEEDLE) IMPLANT
PACK CYSTO (CUSTOM PROCEDURE TRAY) ×4 IMPLANT
PLUG CATH AND CAP STER (CATHETERS) ×4 IMPLANT
RETRACTOR STAY HOOK 5MM (MISCELLANEOUS) ×4 IMPLANT
SHEET LAVH (DRAPES) ×4 IMPLANT
SUT CAPIO ETHIBPND (SUTURE) IMPLANT
SUT CAPIO POLYGLYCOLIC (SUTURE) IMPLANT
SUT ETHIBOND 0 (SUTURE) ×8 IMPLANT
SUT SILK 2 0 SH (SUTURE) ×28 IMPLANT
SUT VIC AB 0 CT1 27 (SUTURE)
SUT VIC AB 0 CT1 27XBRD ANTBC (SUTURE) IMPLANT
SUT VIC AB 2-0 CT1 27 (SUTURE)
SUT VIC AB 2-0 CT1 27XBRD (SUTURE) IMPLANT
SUT VIC AB 2-0 CT2 27 (SUTURE) ×4 IMPLANT
SUT VIC AB 2-0 SH 27 (SUTURE) ×4
SUT VIC AB 2-0 SH 27X BRD (SUTURE) ×4 IMPLANT
SUT VIC AB 3-0 SH 27 (SUTURE)
SUT VIC AB 3-0 SH 27XBRD (SUTURE) IMPLANT
SUT VIC AB 4-0 PS2 27 (SUTURE) ×4 IMPLANT
SUT VICRYL 0 UR6 27IN ABS (SUTURE) IMPLANT
SYRINGE 10CC LL (SYRINGE) ×4 IMPLANT
TOWEL OR NON WOVEN STRL DISP B (DISPOSABLE) ×4 IMPLANT
TUBING CONNECTING 10 (TUBING) ×3 IMPLANT
TUBING CONNECTING 10' (TUBING) ×1
WATER STERILE IRR 1500ML POUR (IV SOLUTION) IMPLANT
YANKAUER SUCT BULB TIP 10FT TU (MISCELLANEOUS) ×4 IMPLANT

## 2015-10-13 NOTE — Anesthesia Postprocedure Evaluation (Signed)
Anesthesia Post Note  Patient: [name]  Procedure(s) Performed: Procedure(s) (LRB): CYSTOSCOPY (N/A) excision and closure of vaginal sinus  Patient location during evaluation: PACU Anesthesia Type: General Level of consciousness: awake and alert Pain management: pain level controlled Vital Signs Assessment: post-procedure vital signs reviewed and stable Respiratory status: spontaneous breathing, nonlabored ventilation, respiratory function stable and patient connected to nasal cannula oxygen Cardiovascular status: blood pressure returned to baseline and stable Postop Assessment: no signs of nausea or vomiting Anesthetic complications: no    Last Vitals:  Vitals:   10/13/15 1230 10/13/15 1245  BP: (!) 142/81 (!) 141/92  Pulse: (!) 56 62  Resp: 13 15  Temp:      Last Pain:  Vitals:   10/13/15 1245  TempSrc:   PainSc: 6                  Shelton Silvas

## 2015-10-13 NOTE — Discharge Instructions (Signed)
You may resume aspirin, advil, aleve, vitamins, and supplements 5 days after surgery.  Activity:  You are encouraged to ambulate frequently (about every hour during waking hours) to help prevent blood clots from forming in your legs or lungs.  However, you should not engage in any heavy lifting (> 10-15 lbs), strenuous activity, or straining for 6 weeks. It is OK to start driving when you are off narcotic pain medications. No intercourse or placing anything within the vagina for 6 weeks.   Diet: You may resume a regular diet.  Prescriptions: You will be provided a prescription for pain medication to take as needed.  If your pain is not severe enough to require the prescription pain medication, you may take extra strength Tylenol instead which will have less side effects.  You should also take an over the counter stool softener to avoid straining with bowel movements as the prescription pain medication may constipate you. Do not drive while taking narcotic pain medications. Take Colace and/or Senna while taking narcotic pain medication. You may take over-the-counter Laxatives such as Miralax, Senna, or Milk of Magnesia. Stop taking these medications if you develop diarrhea.  You may start showering (but not soaking or bathing in water) the 2nd day after surgery and the incisions simply need to be patted dry after the shower. No baths or submerging for 2 weeks. Do not scrub over the incisions, simply let soap and water run over them.  Do not soak the incision (such as a bath) for 2 weeks after surgery.  No additional care is needed.  What to call us about: You should call the office [phone]) if you develop fever > 101, significant bleeding, develop persistent vomiting.  Resume home medications as prescribed by your primary care physician.  It is normal to have some vaginal spotting and bleeding for 1-2 weeks following this surgery.                                  General Anesthesia, Adult, Care  After Refer to this sheet in the next few weeks. These instructions provide you with information on caring for yourself after your procedure. Your health care provider may also give you more specific instructions. Your treatment has been planned according to current medical practices, but problems sometimes occur. Call your health care provider if you have any problems or questions after your procedure. WHAT TO EXPECT AFTER THE PROCEDURE After the procedure, it is typical to experience:  Sleepiness.  Nausea and vomiting. HOME CARE INSTRUCTIONS  For the first 24 hours after general anesthesia:  Have a responsible person with you.  Do not drive a car. If you are alone, do not take public transportation.  Do not drink alcohol.  Do not take medicine that has not been prescribed by your health care provider.  Do not sign important papers or make important decisions.  You may resume a normal diet and activities as directed by your health care provider  If you have questions or problems that seem related to general anesthesia, call the hospital and ask for the anesthetist or anesthesiologist on call. SEEK MEDICAL CARE IF:  You have nausea and vomiting that continue the day after anesthesia.  You develop a rash. SEEK IMMEDIATE MEDICAL CARE IF:   You have difficulty breathing.  You have chest pain.  You have any allergic problems.   This information is not intended to replace advice given  to you by your health care provider. Make sure you discuss any questions you have with your health care provider.   Document Released: 05/23/2000 Document Revised: 03/07/2014 Document Reviewed: 06/15/2011 Elsevier Interactive Patient Education [employer]

## 2015-10-13 NOTE — Op Note (Signed)
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 Lorin Picket Tighe Gitto  The patient has the above diagnoses and consented to the preoperative diagnosis to have a sling. The patient was prepped and draped in the usual fashion. Extra care was taken with leg positioning to minimize the risk of compartment syndrome and neuropathy and deep vein thrombosis. I delayed the abdominal wall incisions to first carefully examine her under anesthesia based upon preoperative findings and previous history.  Based upon the findings I utilized my double-ring retractor with excellent lighting. 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 It was thinly epithelialized with thicker tissue to the left and right including pubocervical fascia and vaginal wall mucosa. Having said that there was scarring with not a lot of length from the area noted to the sidewall.  My plan was to excise the inner lining of the sinus and developed flaps to close over the sling. This was achieved with a gentle scalpel blade and scissors and the majority of the sinus tract was excised. It was a very superficial removal of the lining. On the patient's right side I had made two linear incisions from midline to the patient's right side to help develop a flap for closure. There was a lot of tethering and scarring on the left side that would make this more difficult with much less mobility. I did not feel an inverted U-shaped incision with flap was necessary or would change my management  It was obvious by palpation and vision that there was not a lot of tissue in the midline underneath her suburethra approximately mid urethra. In my opinion a sling should not be placed in fear of injuring the urethra and erosion. I was also concerned about  how thick the tissue and how healthy tissue would be in closing over the sling with risk of extrusion. I did not feel a pubovaginal sling for the same reasons was in the patient's best interest and she was not fully consented for this  I decided to close the pubocervical fascia and vaginal wall with approximate 6 or 7 interrupted 2-0 Vicryl sutures. I was very happy with the closure but it was still a bit tenuous if one had a foreign body underneath. Hemostasis was excellent.  I cystoscoped the patient. The urethral lumen was normal. There was no narrowing. There is no foreign body in the bladder or urethra  A gentle vaginal pack was inserted and the patient was taken to recovery. I will be recommending Macroplastique for her moderate outlet abnormality. Injections will be more lateral and not at 6:00 to avoid risks of erosion etc.

## 2015-10-13 NOTE — Transfer of Care (Signed)
Immediate Anesthesia Transfer of Care Note  Patient: SHERREY SUBER  Procedure(s) Performed: Procedure(s): CYSTOSCOPY (N/A) PUBO-VAGINAL SLING (N/A)  Patient Location: PACU  Anesthesia Type:General  Level of Consciousness: awake, alert  and sedated  Airway & Oxygen Therapy: Patient connected to face mask oxygen  Post-op Assessment: Report given to RN and Post -op Vital signs reviewed and stable  Post vital signs: Reviewed and stable  Last Vitals:  Vitals:   10/13/15 0812  BP: (!) 133/91  Pulse: 74  Resp: 16  Temp: 36.7 C    Last Pain:  Vitals:   10/13/15 0812  TempSrc: Oral      Patients Stated Pain Goal: 2 (10/13/15 9458)  Complications: No apparent anesthesia complications

## 2015-10-13 NOTE — Interval H&P Note (Signed)
History and Physical Interval Note:  10/13/2015 10:20 AM  [name]  has presented today for surgery, with the diagnosis of STRESS INCONTIENENCE   The various methods of treatment have been discussed with the patient and family. After consideration of risks, benefits and other options for treatment, the patient has consented to  Procedure(s): CYSTOSCOPY (N/A) PUBO-VAGINAL SLING (N/A) as a surgical intervention .  The patient's history has been reviewed, patient examined, no change in status, stable for surgery.  I have reviewed the patient's chart and labs.  Questions were answered to the patient's satisfaction.     [name] A

## 2015-10-13 NOTE — Anesthesia Procedure Notes (Addendum)
Procedure Name: LMA Insertion Date/Time: 10/13/2015 10:55 AM Performed by: Renaldo Fiddler EVETTE Pre-anesthesia Checklist: Patient identified and Suction available Patient Re-evaluated:Patient Re-evaluated prior to inductionOxygen Delivery Method: Circle system utilized Preoxygenation: Pre-oxygenation with 100% oxygen Intubation Type: IV induction Ventilation: Mask ventilation without difficulty LMA: LMA inserted LMA Size: 4.0 Number of attempts: 1 Airway Equipment and Method: Patient positioned with wedge pillow Placement Confirmation: positive ETCO2,  CO2 detector and breath sounds checked- equal and bilateral

## 2015-10-13 NOTE — Anesthesia Preprocedure Evaluation (Addendum)
Anesthesia Evaluation  Patient identified by MRN, date of birth, ID band Patient awake    Reviewed: Allergy & Precautions, NPO status , Patient's Chart, lab work & pertinent test results, reviewed documented beta blocker date and time   Airway Mallampati: I  TM Distance: >3 FB Neck ROM: Full    Dental  (+) Teeth Intact, Dental Advisory Given   Pulmonary asthma , sleep apnea and Continuous Positive Airway Pressure Ventilation , Current Smoker,    breath sounds clear to auscultation       Cardiovascular hypertension, Pt. on medications and Pt. on home beta blockers + CAD and + CABG   Rhythm:Regular Rate:Normal     Neuro/Psych PSYCHIATRIC DISORDERS Bipolar Disorder    GI/Hepatic Neg liver ROS, GERD  Medicated,  Endo/Other    Renal/GU negative Renal ROS  negative genitourinary   Musculoskeletal   Abdominal   Peds negative pediatric ROS (+)  Hematology   Anesthesia Other Findings   Reproductive/Obstetrics                            Lab Results  Component Value Date   WBC 7.5 10/06/2015   HGB 15.0 10/06/2015   HCT 42.3 10/06/2015   MCV 88.5 10/06/2015   PLT 201 10/06/2015   Lab Results  Component Value Date   CREATININE 0.88 10/06/2015   BUN 15 10/06/2015   NA 138 10/06/2015   K 3.5 10/06/2015   CL 104 10/06/2015   CO2 27 10/06/2015   Lab Results  Component Value Date   INR 1.05 10/06/2015   INR 0.99 08/26/2014   INR 1.05 06/17/2014   2017 EKG: normal sinus rhythm.  2016 Echo ([facility]) Normal EF 50% Mild MR No AS, No AI  Anesthesia Physical Anesthesia Plan  ASA: III  Anesthesia Plan: General   Post-op Pain Management:    Induction: Intravenous  Airway Management Planned: LMA  Additional Equipment:   Intra-op Plan:   Post-operative Plan: Extubation in OR  Informed Consent: I have reviewed the patients History and Physical, chart, labs and discussed the procedure  including the risks, benefits and alternatives for the proposed anesthesia with the patient or authorized representative who has indicated his/her understanding and acceptance.   Dental advisory given  Plan Discussed with: CRNA  Anesthesia Plan Comments:         Anesthesia Quick Evaluation

## 2016-06-03 ENCOUNTER — Ambulatory Visit (INDEPENDENT_AMBULATORY_CARE_PROVIDER_SITE_OTHER): Payer: 59

## 2016-06-03 ENCOUNTER — Ambulatory Visit (INDEPENDENT_AMBULATORY_CARE_PROVIDER_SITE_OTHER): Payer: 59 | Admitting: Physician Assistant

## 2016-06-03 VITALS — BP 135/87 | HR 74 | Temp 97.8°F | Resp 17 | Ht 66.5 in | Wt 197.0 lb

## 2016-06-03 DIAGNOSIS — M62838 Other muscle spasm: Secondary | ICD-10-CM

## 2016-06-03 DIAGNOSIS — M545 Low back pain, unspecified: Secondary | ICD-10-CM

## 2016-06-03 MED ORDER — CYCLOBENZAPRINE HCL 10 MG PO TABS: 10.0000 mg | ORAL_TABLET | Freq: Three times a day (TID) | ORAL | 0 refills | Status: DC | PRN

## 2016-06-03 MED ORDER — KETOROLAC TROMETHAMINE 60 MG/2ML IM SOLN
60.0000 mg | Freq: Once | INTRAMUSCULAR | Status: AC
  Administered 2016-06-03: 60 mg via INTRAMUSCULAR

## 2016-06-03 MED ORDER — TRAMADOL-ACETAMINOPHEN 37.5-325 MG PO TABS: ORAL_TABLET | ORAL | 0 refills | Status: DC

## 2016-06-03 NOTE — Patient Instructions (Addendum)
I recommend resting today. However, tomorrow I would begin walking and moving around as much as tolerated. Begin stretching in a couple of days. The worse thing you can do for low back pain is lie in bed all day or sit down all day. Use medications as needed.   Just to know, flexeril can cause side effects that may impair your thinking or reactions. Be careful if you drive or do anything that requires you to be awake and alert. void drinking alcohol, which can increase some of the side effects of Flexeril.  I recommend taking 600mg  ibuprofen every 6 hours and using ultracet for breakthrough pain. Do not take any ibuprofen today until 8 hours from your office visit as you had a high dose of medication in office.   You should avoid heavy lifting or strenuous repetitive activity to prevent recurrence of event. Experiment with both ice and heat and choose whichever feels best for you.  Use heat pad or ice pack, do not apply directly to skin, use barrier such as towel over the skin. Leave on for 15-20 minutes, 3-4 times a day.  Please perform exercises below. Stretches are to be performed for 2 sets, holding 10-15 seconds each. Recommended to perform this rehab twice daily within pain tolerance for 2 weeks.  -Return to clinic if symptoms worsen, do not improve in 1-2 weeks, or as needed   FLEXION RANGE OF MOTION AND STRETCHING EXERCISES: STRETCH - Flexion, Single Knee to Chest   Lie on a firm bed or floor with both legs extended in front of you.  Keeping one leg in contact with the floor, bring your opposite knee to your chest. Hold your leg in place by either grabbing behind your thigh or at your knee.  Pull until you feel a gentle stretch in your lower back.   Slowly release your grasp and repeat the exercise with the opposite side.  STRETCH - Flexion, Double Knee to Chest   Lie on a firm bed or floor with both legs extended in front of you.  Keeping one leg in contact with the floor,  bring your opposite knee to your chest.  Tense your stomach muscles to support your back and then lift your other knee to your chest. Hold your legs in place by either grabbing behind your thighs or at your knees.  Pull both knees toward your chest until you feel a gentle stretch in your lower back.   Tense your stomach muscles and slowly return one leg at a time to the floor.  STRETCH - Low Trunk Rotation  Lie on a firm bed or floor. Keeping your legs in front of you, bend your knees so they are both pointed toward the ceiling and your feet are flat on the floor.  Extend your arms out to the side. This will stabilize your upper body by keeping your shoulders in contact with the floor.  Gently and slowly drop both knees together to one side until you feel a gentle stretch in your lower back.   Tense your stomach muscles to support your lower back as you bring your knees back to the starting position. Repeat the exercise to the other side.   EXTENSION RANGE OF MOTION AND FLEXIBILITY EXERCISES: STRETCH - Extension, Prone on Elbows   Lie on your stomach on the floor, a bed will be too soft. Place your palms about shoulder width apart and at the height of your head.  Place your elbows under your  shoulders. If this is too painful, stack pillows under your chest.  Allow your body to relax so that your hips drop lower and make contact more completely with the floor.  Slowly return to lying flat on the floor.  RANGE OF MOTION - Extension, Prone Press Ups  Lie on your stomach on the floor, a bed will be too soft. Place your palms about shoulder width apart and at the height of your head.  Keeping your back as relaxed as possible, slowly straighten your elbows while keeping your hips on the floor. You may adjust the placement of your hands to maximize your comfort. As you gain motion, your hands will come more underneath your shoulders.  Slowly return to lying flat on the floor.  RANGE OF  MOTION- Quadruped, Neutral Spine   Assume a hands and knees position on a firm surface. Keep your hands under your shoulders and your knees under your hips. You may place padding under your knees for comfort.  Drop your head and point your tail bone toward the ground below you. This will round out your lower back like an angry cat.    Slowly lift your head and release your tail bone so that your back sags into a large arch, like an old horse.  Repeat this until you feel limber in your lower back.  Now, find your "sweet spot." This will be the most comfortable position somewhere between the two previous positions. This is your neutral spine. Once you have found this position, tense your stomach muscles to support your lower back.  STRENGTHENING EXERCISES - Low Back Strain These exercises may help you when beginning to rehabilitate your injury. These exercises should be done near your "sweet spot." This is the neutral, low-back arch, somewhere between fully rounded and fully arched, that is your least painful position. When performed in this safe range of motion, these exercises can be used for people who have either a flexion or extension based injury. These exercises may resolve your symptoms with or without further involvement from your physician, physical therapist or athletic trainer. While completing these exercises, remember:   Muscles can gain both the endurance and the strength needed for everyday activities through controlled exercises.  Complete these exercises as instructed by your physician, physical therapist or athletic trainer. Increase the resistance and repetitions only as guided.  You may experience muscle soreness or fatigue, but the pain or discomfort you are trying to eliminate should never worsen during these exercises. If this pain does worsen, stop and make certain you are following the directions exactly. If the pain is still present after adjustments, discontinue the  exercise until you can discuss the trouble with your caregiver.  STRENGTHENING - Deep Abdominals, Pelvic Tilt  Lie on a firm bed or floor. Keeping your legs in front of you, bend your knees so they are both pointed toward the ceiling and your feet are flat on the floor.  Tense your lower abdominal muscles to press your lower back into the floor. This motion will rotate your pelvis so that your tail bone is scooping upwards rather than pointing at your feet or into the floor.  STRENGTHENING - Abdominals, Crunches   Lie on a firm bed or floor. Keeping your legs in front of you, bend your knees so they are both pointed toward the ceiling and your feet are flat on the floor. Cross your arms over your chest.  Slightly tip your chin down without bending your neck.  Tense your abdominals and slowly lift your trunk high enough to just clear your shoulder blades. Lifting higher can put excessive stress on the lower back and does not further strengthen your abdominal muscles.  Control your return to the starting position.  STRENGTHENING - Quadruped, Opposite UE/LE Lift   Assume a hands and knees position on a firm surface. Keep your hands under your shoulders and your knees under your hips. You may place padding under your knees for comfort.  Find your neutral spine and gently tense your abdominal muscles so that you can maintain this position. Your shoulders and hips should form a rectangle that is parallel with the floor and is not twisted.  Keeping your trunk steady, lift your right hand no higher than your shoulder and then your left leg no higher than your hip. Make sure you are not holding your breath.   Continuing to keep your abdominal muscles tense and your back steady, slowly return to your starting position. Repeat with the opposite arm and leg.  STRENGTHENING - Lower Abdominals, Double Knee Lift  Lie on a firm bed or floor. Keeping your legs in front of you, bend your knees so they  are both pointed toward the ceiling and your feet are flat on the floor.  Tense your abdominal muscles to brace your lower back and slowly lift both of your knees until they come over your hips. Be certain not to hold your breath.  POSTURE AND BODY MECHANICS CONSIDERATIONS - Low Back Strain Keeping correct posture when sitting, standing or completing your activities will reduce the stress put on different body tissues, allowing injured tissues a chance to heal and limiting painful experiences. The following are general guidelines for improved posture. Your physician or physical therapist will provide you with any instructions specific to your needs. While reading these guidelines, remember:  The exercises prescribed by your provider will help you have the flexibility and strength to maintain correct postures.  The correct posture provides the best environment for your joints to work. All of your joints have less wear and tear when properly supported by a spine with good posture. This means you will experience a healthier, less painful body.  Correct posture must be practiced with all of your activities, especially prolonged sitting and standing. Correct posture is as important when doing repetitive low-stress activities (typing) as it is when doing a single heavy-load activity (lifting). RESTING POSITIONS Consider which positions are most painful for you when choosing a resting position. If you have pain with flexion-based activities (sitting, bending, stooping, squatting), choose a position that allows you to rest in a less flexed posture. You would want to avoid curling into a fetal position on your side. If your pain worsens with extension-based activities (prolonged standing, working overhead), avoid resting in an extended position such as sleeping on your stomach. Most people will find more comfort when they rest with their spine in a more neutral position, neither too rounded nor too arched. Lying  on a non-sagging bed on your side with a pillow between your knees, or on your back with a pillow under your knees will often provide some relief. Keep in mind, being in any one position for a prolonged period of time, no matter how correct your posture, can still lead to stiffness. PROPER SITTING POSTURE In order to minimize stress and discomfort on your spine, you must sit with correct posture. Sitting with good posture should be effortless for a healthy body. Returning to good  posture is a gradual process. Many people can work toward this most comfortably by using various supports until they have the flexibility and strength to maintain this posture on their own. When sitting with proper posture, your ears will fall over your shoulders and your shoulders will fall over your hips. You should use the back of the chair to support your upper back. Your lower back will be in a neutral position, just slightly arched. You may place a small pillow or folded towel at the base of your lower back for support.  When working at a desk, create an environment that supports good, upright posture. Without extra support, muscles tire, which leads to excessive strain on joints and other tissues. Keep these recommendations in mind: CHAIR:  A chair should be able to slide under your desk when your back makes contact with the back of the chair. This allows you to work closely.  The chair's height should allow your eyes to be level with the upper part of your monitor and your hands to be slightly lower than your elbows. BODY POSITION  Your feet should make contact with the floor. If this is not possible, use a foot rest.  Keep your ears over your shoulders. This will reduce stress on your neck and lower back. INCORRECT SITTING POSTURES  If you are feeling tired and unable to assume a healthy sitting posture, do not slouch or slump. This puts excessive strain on your back tissues, causing more damage and pain. Healthier  options include:  Using more support, like a lumbar pillow.  Switching tasks to something that requires you to be upright or walking.  Talking a brief walk.  Lying down to rest in a neutral-spine position. PROLONGED STANDING WHILE SLIGHTLY LEANING FORWARD  When completing a task that requires you to lean forward while standing in one place for a long time, place either foot up on a stationary 2-4 inch high object to help maintain the best posture. When both feet are on the ground, the lower back tends to lose its slight inward curve. If this curve flattens (or becomes too large), then the back and your other joints will experience too much stress, tire more quickly, and can cause pain. CORRECT STANDING POSTURES Proper standing posture should be assumed with all daily activities, even if they only take a few moments, like when brushing your teeth. As in sitting, your ears should fall over your shoulders and your shoulders should fall over your hips. You should keep a slight tension in your abdominal muscles to brace your spine. Your tailbone should point down to the ground, not behind your body, resulting in an over-extended swayback posture.  INCORRECT STANDING POSTURES  Common incorrect standing postures include a forward head, locked knees and/or an excessive swayback. WALKING Walk with an upright posture. Your ears, shoulders and hips should all line-up. PROLONGED ACTIVITY IN A FLEXED POSITION When completing a task that requires you to bend forward at your waist or lean over a low surface, try to find a way to stabilize 3 out of 4 of your limbs. You can place a hand or elbow on your thigh or rest a knee on the surface you are reaching across. This will provide you more stability so that your muscles do not fatigue as quickly. By keeping your knees relaxed, or slightly bent, you will also reduce stress across your lower back. CORRECT LIFTING TECHNIQUES DO :   Assume a wide stance. This will  provide you  more stability and the opportunity to get as close as possible to the object which you are lifting.  Tense your abdominals to brace your spine. Bend at the knees and hips. Keeping your back locked in a neutral-spine position, lift using your leg muscles. Lift with your legs, keeping your back straight.  Test the weight of unknown objects before attempting to lift them.  Try to keep your elbows locked down at your sides in order get the best strength from your shoulders when carrying an object.  Always ask for help when lifting heavy or awkward objects. INCORRECT LIFTING TECHNIQUES DO NOT:   Lock your knees when lifting, even if it is a small object.  Bend and twist. Pivot at your feet or move your feet when needing to change directions.  Assume that you can safely pick up even a paper clip without proper posture.       IF you received an x-ray today, you will receive an invoice from [facility]. Please contact [facility] at [phone] with questions or concerns regarding your invoice.   IF you received labwork today, you will receive an invoice from [facility]. Please contact [facility] at [phone] with questions or concerns regarding your invoice.   Our billing staff will not be able to assist you with questions regarding bills from these companies.  You will be contacted with the lab results as soon as they are available. The fastest way to get your results is to activate your My Chart account. Instructions are located on the last page of this paperwork. If you have not heard from [place] regarding the results in 2 weeks, please contact this office.

## 2016-06-03 NOTE — Progress Notes (Signed)
[name]  MRN: [id number] DOB: 05/19/1965  Subjective:  [name] is a 51 y.o. female seen in office today for a chief complaint of back pain x 3 days. She was rearranging furniture 5 days ago and immediately felt a pain in left lower back and fell to her knees . She has tried heating pad, biofreeze, ibuprofen with no relief. She cannot get comfortable in any position. Has associated muscle stiffness. Denies saddle anesthesia, bladder/bowel incontinence, radiculopathy, numbness, and tingling. She has not had any medication this morning.   Review of Systems  Constitutional: Negative for chills, diaphoresis and fever.  Genitourinary: Negative for dysuria, flank pain, frequency and hematuria.    Patient Active Problem List   Diagnosis Date Noted  . Asthma 06/30/2015  . 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 albuterol (PROVENTIL HFA;VENTOLIN HFA) 108 (90 BASE) MCG/ACT inhaler Inhale 2 puffs into the lungs every 4 (four) hours as needed for wheezing or shortness of breath. 1 Inhaler 0  . aspirin EC 325 MG tablet Take 325 mg by mouth daily.    Marland Kitchen atorvastatin (LIPITOR) 20 MG tablet Take 20 mg by mouth daily.    Marland Kitchen azelastine (ASTELIN) 0.1 % nasal spray Place 2 sprays into both nostrils 2 (two) times daily. Use in each nostril as directed 30 mL 5  . ibuprofen (ADVIL,MOTRIN) 200 MG tablet Take 800 mg by mouth daily as needed (pain).    . isosorbide mononitrate (IMDUR) 60 MG 24 hr tablet Take 60 mg by mouth 2 (two) times daily.     . metoprolol succinate (TOPROL-XL) 25 MG 24 hr tablet Take 25 mg by mouth daily.    . nitroGLYCERIN (NITROSTAT) 0.4 MG SL tablet Place 0.4 mg under the tongue every 5 (five) minutes as needed for chest pain.    Marland Kitchen omeprazole (PRILOSEC OTC) 20 MG tablet Take 20 mg by mouth daily.    . potassium chloride SA (KLOR-CON M20) 20 MEQ tablet Take  20 mEq by mouth daily.     . ranolazine (RANEXA) 1000 MG SR tablet Take 1,000 mg by mouth 2 (two) times daily.    . varenicline (CHANTIX) 1 MG tablet Take 1 mg by mouth 2 (two) times daily.     No current facility-administered medications on file prior to visit.     No Known Allergies     Social History   Social History  . Marital status: Significant Other    Spouse name: N/A  . Number of children: N/A  . Years of education: N/A   Occupational History  . Not on file.   Social History Main Topics  . Smoking status: Current Every Day Smoker    Packs/day: 1.00    Years: 37.00    Types: Cigarettes  . Smokeless tobacco: Never Used     Comment: 1/2 TO 3/4 PPD  . Alcohol use Yes     Comment: RARE  . Drug use: No  . Sexual activity: Not on file   Other Topics Concern  . Not on file   Social History Narrative  . No narrative on file    Objective:  BP 135/87   Pulse 74   Temp 97.8 F (36.6 C) (Oral)   Resp 17   Ht 5' 6.5" (1.689 m)   Wt 197 lb (89.4 kg)   SpO2 97%   BMI 31.32 kg/m   Physical Exam  Constitutional: She is oriented to person, place, and time. She appears distressed (appears uncomfortable when moving from a seated position).  Well developed, well nourished.    HENT:  Head: Normocephalic and atraumatic.  Eyes: Conjunctivae are normal.  Neck: Normal range of motion.  Pulmonary/Chest: Effort normal.  Musculoskeletal:       Cervical back: Normal.       Thoracic back: Normal.       Lumbar back: She exhibits decreased range of motion, tenderness (with palpation of musculature bilaterally) and spasm (of musculature bilaterally). She exhibits no bony tenderness.  Neurological: She is alert and oriented to person, place, and time. She has normal sensation, normal strength and normal reflexes. Gait normal. Gait normal.  Skin: Skin is warm and dry.  Psychiatric: Affect normal.  Vitals reviewed.  Dg Lumbar Spine Complete  Result Date: 06/03/2016 CLINICAL  DATA:  Acute low back pain secondary to a  lifting injury. EXAM: LUMBAR SPINE - COMPLETE 4+ VIEW COMPARISON:  None. FINDINGS: There is no evidence of lumbar spine fracture. Alignment is normal. Intervertebral disc spaces are maintained. No facet arthritis. Calcification in the abdominal aorta. IMPRESSION: Normal lumbar spine.  Aortic atherosclerosis. Electronically Signed   By: Francene Boyers M.D.   On: 06/03/2016 10:15   Post admin of 60mg  of IM Toradol, pt reports she is getting some relief.  Assessment and Plan :  1. Acute bilateral low back pain without sciatica Consistent with muscle strain. Given educational material for low back stretches. Instructed to use ibuprofen every 6-8 hours and ultracet for breakthrough pain. Instructed to avoid heavy lifting until pain resolves. Return to clinic if symptoms worsen, do not improve in 7-10 days, or as needed - DG Lumbar Spine Complete; Future - ketorolac (TORADOL) injection 60 mg; Inject 2 mLs (60 mg total) into the muscle once. - traMADol-acetaminophen (ULTRACET) 37.5-325 MG tablet; Take 1-2 tablets as needed every 4-6 hours for breakthrough pain.  Dispense: 20 tablet; Refill: 0  2. Muscle spasm - cyclobenzaprine (FLEXERIL) 10 MG tablet; Take 1 tablet (10 mg total) by mouth 3 (three) times daily as needed for muscle spasms.  Dispense: 30 tablet; Refill: 0   Benjiman Core PA-C  [facility] 06/03/2016 9:45 AM

## 2016-08-18 ENCOUNTER — Other Ambulatory Visit: Payer: Self-pay | Admitting: Obstetrics and Gynecology

## 2016-08-19 ENCOUNTER — Other Ambulatory Visit: Payer: Self-pay | Admitting: Obstetrics and Gynecology

## 2016-08-19 DIAGNOSIS — N6489 Other specified disorders of breast: Secondary | ICD-10-CM

## 2016-08-24 ENCOUNTER — Ambulatory Visit
Admission: RE | Admit: 2016-08-24 | Discharge: 2016-08-24 | Disposition: A | Discharge status: Home / Self Care | Payer: Managed Care, Other (non HMO) | Source: Ambulatory Visit | Attending: Obstetrics and Gynecology | Admitting: Obstetrics and Gynecology

## 2016-08-24 ENCOUNTER — Other Ambulatory Visit: Payer: Self-pay | Admitting: Obstetrics and Gynecology

## 2016-08-24 DIAGNOSIS — N6320 Unspecified lump in the left breast, unspecified quadrant: Secondary | ICD-10-CM

## 2016-08-24 DIAGNOSIS — N6489 Other specified disorders of breast: Secondary | ICD-10-CM

## 2016-08-24 DIAGNOSIS — R599 Enlarged lymph nodes, unspecified: Secondary | ICD-10-CM

## 2016-08-25 ENCOUNTER — Other Ambulatory Visit: Payer: Self-pay | Admitting: Obstetrics and Gynecology

## 2016-08-25 DIAGNOSIS — N63 Unspecified lump in unspecified breast: Secondary | ICD-10-CM

## 2017-05-06 ENCOUNTER — Encounter (HOSPITAL_COMMUNITY): Payer: Self-pay | Admitting: Emergency Medicine

## 2017-05-06 ENCOUNTER — Inpatient Hospital Stay (HOSPITAL_COMMUNITY)
Admission: EM | Admit: 2017-05-06 | Discharge: 2017-05-10 | DRG: 282 | Disposition: A | Discharge status: Home / Self Care | Payer: 59 | Attending: Internal Medicine | Admitting: Internal Medicine

## 2017-05-06 ENCOUNTER — Emergency Department (HOSPITAL_COMMUNITY): Payer: 59

## 2017-05-06 ENCOUNTER — Other Ambulatory Visit: Payer: Self-pay

## 2017-05-06 DIAGNOSIS — Z6830 Body mass index (BMI) 30.0-30.9, adult: Secondary | ICD-10-CM

## 2017-05-06 DIAGNOSIS — Z79899 Other long term (current) drug therapy: Secondary | ICD-10-CM

## 2017-05-06 DIAGNOSIS — Z7982 Long term (current) use of aspirin: Secondary | ICD-10-CM

## 2017-05-06 DIAGNOSIS — Z951 Presence of aortocoronary bypass graft: Secondary | ICD-10-CM

## 2017-05-06 DIAGNOSIS — G4730 Sleep apnea, unspecified: Secondary | ICD-10-CM | POA: Diagnosis present

## 2017-05-06 DIAGNOSIS — I213 ST elevation (STEMI) myocardial infarction of unspecified site: Secondary | ICD-10-CM | POA: Diagnosis present

## 2017-05-06 DIAGNOSIS — E669 Obesity, unspecified: Secondary | ICD-10-CM | POA: Diagnosis present

## 2017-05-06 DIAGNOSIS — Z9889 Other specified postprocedural states: Secondary | ICD-10-CM

## 2017-05-06 DIAGNOSIS — I10 Essential (primary) hypertension: Secondary | ICD-10-CM | POA: Diagnosis present

## 2017-05-06 DIAGNOSIS — R51 Headache: Secondary | ICD-10-CM | POA: Diagnosis not present

## 2017-05-06 DIAGNOSIS — I214 Non-ST elevation (NSTEMI) myocardial infarction: Secondary | ICD-10-CM

## 2017-05-06 DIAGNOSIS — F319 Bipolar disorder, unspecified: Secondary | ICD-10-CM | POA: Insufficient documentation

## 2017-05-06 DIAGNOSIS — I2510 Atherosclerotic heart disease of native coronary artery without angina pectoris: Secondary | ICD-10-CM | POA: Diagnosis present

## 2017-05-06 DIAGNOSIS — I501 Left ventricular failure: Secondary | ICD-10-CM | POA: Diagnosis not present

## 2017-05-06 DIAGNOSIS — Z955 Presence of coronary angioplasty implant and graft: Secondary | ICD-10-CM

## 2017-05-06 DIAGNOSIS — F17210 Nicotine dependence, cigarettes, uncomplicated: Secondary | ICD-10-CM | POA: Diagnosis present

## 2017-05-06 DIAGNOSIS — E785 Hyperlipidemia, unspecified: Secondary | ICD-10-CM | POA: Diagnosis present

## 2017-05-06 DIAGNOSIS — Z8249 Family history of ischemic heart disease and other diseases of the circulatory system: Secondary | ICD-10-CM

## 2017-05-06 DIAGNOSIS — I25119 Atherosclerotic heart disease of native coronary artery with unspecified angina pectoris: Secondary | ICD-10-CM | POA: Diagnosis present

## 2017-05-06 LAB — I-STAT BETA HCG BLOOD, ED (MC, WL, AP ONLY): I-stat hCG, quantitative: <5 m[IU]/mL (ref <5)

## 2017-05-06 LAB — LIPID PANEL
CHOLESTEROL: 195 mg/dL (ref 0–200)
HDL: 41 mg/dL (ref >40)
LDL Cholesterol: 117 mg/dL — ABNORMAL HIGH (ref 0–99)
TRIGLYCERIDES: 187 mg/dL — AB (ref <150)
Total CHOL/HDL Ratio: 4.8 RATIO
VLDL: 37 mg/dL (ref 0–40)

## 2017-05-06 LAB — BASIC METABOLIC PANEL
Anion gap: 12 (ref 5–15)
BUN: 7 mg/dL (ref 6–20)
CHLORIDE: 103 mmol/L (ref 101–111)
CO2: 23 mmol/L (ref 22–32)
Calcium: 9.6 mg/dL (ref 8.9–10.3)
Creatinine, Ser: 0.79 mg/dL (ref 0.44–1.00)
GFR calc Af Amer: >60 mL/min (ref >60)
GFR calc non Af Amer: >60 mL/min (ref >60)
GLUCOSE: 126 mg/dL — AB (ref 65–99)
POTASSIUM: 3.6 mmol/L (ref 3.5–5.1)
Sodium: 138 mmol/L (ref 135–145)

## 2017-05-06 LAB — I-STAT TROPONIN, ED: Troponin i, poc: 1.06 ng/mL (ref 0.00–0.08)

## 2017-05-06 LAB — CBC
HEMATOCRIT: 46 % (ref 36.0–46.0)
Hemoglobin: 16.3 g/dL — ABNORMAL HIGH (ref 12.0–15.0)
MCH: 32.5 pg (ref 26.0–34.0)
MCHC: 35.4 g/dL (ref 30.0–36.0)
MCV: 91.6 fL (ref 78.0–100.0)
Platelets: 193 10*3/uL (ref 150–400)
RBC: 5.02 MIL/uL (ref 3.87–5.11)
RDW: 12.4 % (ref 11.5–15.5)
WBC: 11.8 10*3/uL — ABNORMAL HIGH (ref 4.0–10.5)

## 2017-05-06 MED ORDER — ATORVASTATIN CALCIUM 80 MG PO TABS
80.0000 mg | ORAL_TABLET | Freq: Every day | ORAL | Status: DC
  Administered 2017-05-07 – 2017-05-09 (×3): 80 mg via ORAL
  Filled 2017-05-06 (×3): qty 1

## 2017-05-06 MED ORDER — HEPARIN (PORCINE) IN NACL 100-0.45 UNIT/ML-% IJ SOLN
1200.0000 [IU]/h | INTRAMUSCULAR | Status: DC
  Administered 2017-05-06: 1000 [IU]/h via INTRAVENOUS
  Filled 2017-05-06 (×3): qty 250

## 2017-05-06 MED ORDER — PANTOPRAZOLE SODIUM 40 MG PO TBEC
40.0000 mg | DELAYED_RELEASE_TABLET | Freq: Every day | ORAL | Status: DC
  Administered 2017-05-07 – 2017-05-10 (×4): 40 mg via ORAL
  Filled 2017-05-06 (×3): qty 1

## 2017-05-06 MED ORDER — ONDANSETRON HCL 4 MG/2ML IJ SOLN
4.0000 mg | Freq: Four times a day (QID) | INTRAMUSCULAR | Status: DC | PRN
  Administered 2017-05-07: 4 mg via INTRAVENOUS
  Filled 2017-05-06: qty 2

## 2017-05-06 MED ORDER — MORPHINE SULFATE (PF) 4 MG/ML IV SOLN
4.0000 mg | Freq: Once | INTRAVENOUS | Status: AC
  Administered 2017-05-06: 4 mg via INTRAVENOUS
  Filled 2017-05-06: qty 1

## 2017-05-06 MED ORDER — ASPIRIN 81 MG PO CHEW
324.0000 mg | CHEWABLE_TABLET | Freq: Once | ORAL | Status: AC
  Administered 2017-05-06: 324 mg via ORAL
  Filled 2017-05-06: qty 4

## 2017-05-06 MED ORDER — METOPROLOL SUCCINATE ER 25 MG PO TB24
25.0000 mg | ORAL_TABLET | Freq: Every day | ORAL | Status: DC
  Administered 2017-05-07 – 2017-05-10 (×4): 25 mg via ORAL
  Filled 2017-05-06 (×5): qty 1

## 2017-05-06 MED ORDER — ACETAMINOPHEN 325 MG PO TABS
650.0000 mg | ORAL_TABLET | ORAL | Status: DC | PRN
  Administered 2017-05-07 – 2017-05-08 (×5): 650 mg via ORAL
  Filled 2017-05-06 (×5): qty 2

## 2017-05-06 MED ORDER — POTASSIUM CHLORIDE CRYS ER 20 MEQ PO TBCR
20.0000 meq | EXTENDED_RELEASE_TABLET | Freq: Every day | ORAL | Status: DC
  Administered 2017-05-07 – 2017-05-10 (×4): 20 meq via ORAL
  Filled 2017-05-06 (×4): qty 1

## 2017-05-06 MED ORDER — ASPIRIN EC 81 MG PO TBEC
81.0000 mg | DELAYED_RELEASE_TABLET | Freq: Every day | ORAL | Status: DC
  Administered 2017-05-07 – 2017-05-10 (×4): 81 mg via ORAL
  Filled 2017-05-06 (×5): qty 1

## 2017-05-06 MED ORDER — RANOLAZINE ER 500 MG PO TB12
1000.0000 mg | ORAL_TABLET | Freq: Two times a day (BID) | ORAL | Status: DC
  Administered 2017-05-07 – 2017-05-10 (×7): 1000 mg via ORAL
  Filled 2017-05-06 (×7): qty 2

## 2017-05-06 MED ORDER — ESTRADIOL 1 MG PO TABS
1.0000 mg | ORAL_TABLET | Freq: Every day | ORAL | Status: DC
  Administered 2017-05-08 – 2017-05-10 (×3): 1 mg via ORAL
  Filled 2017-05-06 (×4): qty 1

## 2017-05-06 MED ORDER — NITROGLYCERIN 0.4 MG SL SUBL
0.4000 mg | SUBLINGUAL_TABLET | SUBLINGUAL | Status: DC | PRN
  Administered 2017-05-06 (×3): 0.4 mg via SUBLINGUAL
  Filled 2017-05-06: qty 1

## 2017-05-06 MED ORDER — FLUOXETINE HCL 20 MG PO CAPS
20.0000 mg | ORAL_CAPSULE | Freq: Every day | ORAL | Status: DC
  Administered 2017-05-07 – 2017-05-10 (×4): 20 mg via ORAL
  Filled 2017-05-06 (×5): qty 1

## 2017-05-06 MED ORDER — ISOSORBIDE MONONITRATE ER 30 MG PO TB24
60.0000 mg | ORAL_TABLET | Freq: Two times a day (BID) | ORAL | Status: DC
  Administered 2017-05-07 (×2): 60 mg via ORAL
  Filled 2017-05-06 (×2): qty 2

## 2017-05-06 MED ORDER — HEPARIN BOLUS VIA INFUSION
4000.0000 [IU] | Freq: Once | INTRAVENOUS | Status: AC
  Administered 2017-05-06: 4000 [IU] via INTRAVENOUS
  Filled 2017-05-06: qty 4000

## 2017-05-06 MED ORDER — LISINOPRIL 10 MG PO TABS
10.0000 mg | ORAL_TABLET | Freq: Every day | ORAL | Status: DC
  Administered 2017-05-07 – 2017-05-10 (×4): 10 mg via ORAL
  Filled 2017-05-06 (×4): qty 1

## 2017-05-06 NOTE — H&P (Addendum)
CARDIOLOGY H&P  HPI: [name] is a 52 y.o. female w/ severe multivessel CAD s/p 4vCABG in 2004 (LIMA/LAD, RIMA/OM, SVG/RCA, SVG/OM; vein grafts occluded but IMA's patent as of 2012), HTN, active smoking, HLD, and chronic chest pain presents with chest pain.  Patient describes chest pain every day for many years. She typically tries to just deal with her pain and wait for it to pass, though she occasionally has to take sublingual nitroglycerin when it becomes more severe. Her pain always abates after 1 dose of SLN. Today she developed chest pain similar to her typical daily chest pain. This however became more severe than usual. She took several SLN tablets and her pain did not resolve. She thus came to the ED for workup. In the ED she was found to have an elevated troponin. She was given aspirin and started on a heparin drip. She was given 1 dose of morphine, which resulted in improvement of her pain to about 3/10 in severity.   Review of Systems:     Cardiac Review of Systems: {Y] = yes [ ]  = no  Chest Pain [ Y  ]  Resting SOB [   ] Exertional SOB  [  ]  Orthopnea [  ]   Pedal Edema [   ]    Palpitations [  ] Syncope  [  ]   Presyncope [   ]  General Review of Systems: [Y] = yes [  ]=no Constitional: recent weight change [  ]; anorexia [  ]; fatigue [  ]; nausea [  ]; night sweats [  ]; fever [  ]; or chills [  ];                                                                     Dental: poor dentition[  ];   Eye : blurred vision [  ]; diplopia [   ]; vision changes [  ];  Amaurosis fugax[  ]; Resp: cough [  ];  wheezing[  ];  hemoptysis[  ]; shortness of breath[  ]; paroxysmal nocturnal dyspnea[  ]; dyspnea on exertion[  ]; or orthopnea[  ];  GI:  gallstones[  ], vomiting[  ];  dysphagia[  ]; melena[  ];  hematochezia [  ]; heartburn[  ];   GU: kidney stones [  ]; hematuria[  ];   dysuria [  ];  nocturia[  ];               Skin: rash [  ], swelling[  ];, hair loss[  ];  peripheral  edema[  ];  or itching[  ]; Musculosketetal: myalgias[  ];  joint swelling[  ];  joint erythema[  ];  joint pain[  ];  back pain[  ];  Heme/Lymph: bruising[  ];  bleeding[  ];  anemia[  ];  Neuro: TIA[  ];  headaches[  ];  stroke[  ];  vertigo[  ];  seizures[  ];   paresthesias[  ];  difficulty walking[  ];  Psych:depression[  ]; anxiety[  ];  Endocrine: diabetes[  ];  thyroid dysfunction[  ];  Other:  Past Medical History:  Diagnosis Date  . 3-vessel CAD    cardiologist -  dr Ladona Mow [facility])  . Bipolar 1 disorder (HCC)   . Chronic chest pain    controlled w/ meds and prn nitro  . Coronary artery disease   . Family history of premature CAD   . GERD (gastroesophageal reflux disease)   . History of MI (myocardial infarction)   . Hyperlipidemia   . Hypertension   . 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 S/P drug eluting coronary stent placement    X2   in 2005  . Sleep apnea    USES CPAP, PT DOES NOT KNOW SETTINGS  . SUI (stress urinary incontinence, female)     Prior to Admission medications   Medication Sig Start Date End Date Taking? Authorizing Provider  albuterol (PROVENTIL HFA;VENTOLIN HFA) 108 (90 BASE) MCG/ACT inhaler Inhale 2 puffs into the lungs every 4 (four) hours as needed for wheezing or shortness of breath. 06/13/14   Horton, Mayer Masker, MD  aspirin EC 325 MG tablet Take 325 mg by mouth daily.    [provider]  atorvastatin (LIPITOR) 20 MG tablet Take 20 mg by mouth daily.    [provider]  azelastine (ASTELIN) 0.1 % nasal spray Place 2 sprays into both nostrils 2 (two) times daily. Use in each nostril as directed 06/30/15   Collene Gobble, MD  cyclobenzaprine (FLEXERIL) 10 MG tablet Take 1 tablet (10 mg total) by mouth 3 (three) times daily as needed for muscle spasms. 06/03/16   Benjiman Core D, PA-C  ibuprofen (ADVIL,MOTRIN) 200 MG tablet Take 800 mg by mouth daily as needed (pain).    [provider]  isosorbide mononitrate  (IMDUR) 60 MG 24 hr tablet Take 60 mg by mouth 2 (two) times daily.     [provider]  metoprolol succinate (TOPROL-XL) 25 MG 24 hr tablet Take 25 mg by mouth daily.    [provider]  nitroGLYCERIN (NITROSTAT) 0.4 MG SL tablet Place 0.4 mg under the tongue every 5 (five) minutes as needed for chest pain.    [provider]  omeprazole (PRILOSEC OTC) 20 MG tablet Take 20 mg by mouth daily.    [provider]  potassium chloride SA (KLOR-CON M20) 20 MEQ tablet Take 20 mEq by mouth daily.     [provider]  ranolazine (RANEXA) 1000 MG SR tablet Take 1,000 mg by mouth 2 (two) times daily.    [provider]  traMADol-acetaminophen (ULTRACET) 37.5-325 MG tablet Take 1-2 tablets as needed every 4-6 hours for breakthrough pain. 06/03/16   Benjiman Core D, PA-C    No Known Allergies  Social History   Socioeconomic History  . Marital status: Significant Other    Spouse name: Not on file  . Number of children: Not on file  . Years of education: Not on file  . Highest education level: Not on file  Social Needs  . Financial resource strain: Not on file  . Food insecurity - worry: Not on file  . Food insecurity - inability: Not on file  . Transportation needs - medical: Not on file  . Transportation needs - non-medical: Not on file  Occupational History  . Not on file  Tobacco Use  . Smoking status: Current Every Day Smoker    Packs/day: 1.00    Years: 37.00    Pack years: 37.00    Types: Cigarettes  . Smokeless tobacco: Never Used  . Tobacco comment: 1/2 TO 3/4 PPD  Substance and Sexual Activity  .  Alcohol use: Yes    Comment: RARE  . Drug use: No  . Sexual activity: Not on file  Other Topics Concern  . Not on file  Social History Narrative  . Not on file    Family History  Problem Relation Age of Onset  . Heart disease Mother   . Hyperlipidemia Mother   . Hypertension Mother   . Heart disease Maternal Grandmother     . Cancer Brother     PHYSICAL EXAM: Vitals:   05/06/17 2021 05/06/17 2205  BP: (!) 182/107 (!) 176/97  Pulse: 66 65  Resp: 18 17  Temp: 97.9 F (36.6 C)   SpO2: 100% 97%   General:  Well appearing. No respiratory difficulty HEENT: normal Neck: supple. no JVD. Carotids 2+ bilat; no bruits. No lymphadenopathy or thryomegaly appreciated. Cor: PMI nondisplaced. Regular rate & rhythm. No rubs or murmurs. +S4 Lungs: clear Abdomen: soft, nontender, nondistended. No hepatosplenomegaly. No bruits or masses. Good bowel sounds. Extremities: no cyanosis, clubbing, rash, edema Neuro: alert & oriented x 3, cranial nerves grossly intact. moves all 4 extremities w/o difficulty. Affect pleasant.  ECG: NSR, HR 69, normal axis, prolonged QTc, no pathologic Q waves, diffuse 1mm ST depressions suggestive of acute ischemia  Results for orders placed or performed during the hospital encounter of 05/06/17 (from the past 24 hour(s))  Basic metabolic panel     Status: Abnormal   Collection Time: 05/06/17  8:30 PM  Result Value Ref Range   Sodium 138 135 - 145 mmol/L   Potassium 3.6 3.5 - 5.1 mmol/L   Chloride 103 101 - 111 mmol/L   CO2 23 22 - 32 mmol/L   Glucose, Bld 126 (H) 65 - 99 mg/dL   BUN 7 6 - 20 mg/dL   Creatinine, Ser 1.61 0.44 - 1.00 mg/dL   Calcium 9.6 8.9 - 09.6 mg/dL   GFR calc non Af Amer >60 >60 mL/min   GFR calc Af Amer >60 >60 mL/min   Anion gap 12 5 - 15  CBC     Status: Abnormal   Collection Time: 05/06/17  8:30 PM  Result Value Ref Range   WBC 11.8 (H) 4.0 - 10.5 K/uL   RBC 5.02 3.87 - 5.11 MIL/uL   Hemoglobin 16.3 (H) 12.0 - 15.0 g/dL   HCT 04.5 40.9 - 81.1 %   MCV 91.6 78.0 - 100.0 fL   MCH 32.5 26.0 - 34.0 pg   MCHC 35.4 30.0 - 36.0 g/dL   RDW 91.4 78.2 - 95.6 %   Platelets 193 150 - 400 K/uL  I-stat troponin, ED     Status: Abnormal   Collection Time: 05/06/17  8:52 PM  Result Value Ref Range   Troponin i, poc 1.06 (HH) 0.00 - 0.08 ng/mL   Comment NOTIFIED  PHYSICIAN    Comment 3          I-Stat beta hCG blood, ED     Status: None   Collection Time: 05/06/17  8:53 PM  Result Value Ref Range   I-stat hCG, quantitative <5.0 <5 mIU/mL   Comment 3           Dg Chest 2 View  Result Date: 05/06/2017 CLINICAL DATA:  Chest pain EXAM: CHEST - 2 VIEW COMPARISON:  06/13/2014 chest radiograph. FINDINGS: Stable configuration sternotomy wires with discontinuity in the lower most sternotomy wire. CABG clips overlie the mediastinum. Stable cardiomediastinal silhouette with normal heart size. No pneumothorax. No pleural effusion. Lungs appear clear, with  no acute consolidative airspace disease and no pulmonary edema. Cholecystectomy clips are seen in the right upper quadrant of the abdomen. IMPRESSION: No active cardiopulmonary disease. Electronically Signed   By: Delbert Phenix M.D.   On: 05/06/2017 20:52   ASSESSMENT: FABIENNE DOMBROSKI is a 52 y.o. female w/ severe multivessel CAD s/p 4vCABG in 2004 (LIMA/LAD, RIMA/OM, SVG/RCA, SVG/OM; vein grafts occluded but IMA's patent as of 2012), HTN, active smoking, HLD, and chronic chest pain presents with chest pain, found to have NSTEMI.   PLAN/DISCUSSION: #) NSTEMI: severe multivessel CAD w/ prior ACS, prior CABG (2004) with known SVG occlusion and patent LIMA/RIMA in 2012. Not on optimal medical therapy at the time of admisison.  - repeat troponin q6h x 2 - check lipids, A1c - echo in AM - ASA 324mg  then reduce to 81mg  daily (was taking 324mg  daily prior to admission) - heparin drip for ACS per pharmacy protocol - increase home atorvastatin to 80mg  QHS - start lisinopril 10mg  QD - increase metoprolol succinate to 25mg  BID - cont home imdur 60mg  BID - SLN, nitro gtt PRN - smoking cessation counseling - patient would like benefit from chronic P2Y12 antagonist therapy, will defer decision to AM  - also may consider discussing discontinuation of oral estradiol, given the demonstrated increased risk for CV disease in  women taking HRT - discussed likely need for cardiac cath on Monday  Rosario Jacks, MD Cardiology Fellow, PGY-5

## 2017-05-06 NOTE — ED Triage Notes (Signed)
Patient presents to ED for assessment of lef sided chest pain since this morning.  Patient states hx of quadruple bypass with three already blocked again.  States she has had similar pain with her previous MIs.  Also states pain radiates to left shoulder.  Patient states no obvious associated symptoms.  Patient states no sleep last night, had to put her dog down this morning, and no sleep since.

## 2017-05-06 NOTE — Progress Notes (Addendum)
ANTICOAGULATION CONSULT NOTE - Initial Consult  Pharmacy Consult for heparin Indication: chest pain/ACS   Patient Measurements:   Heparin Dosing Weight: 76.3 kg  Vital Signs: Temp: 97.9 F (36.6 C) (03/09 2021) Temp Source: Oral (03/09 2021) BP: 182/107 (03/09 2021) Pulse Rate: 66 (03/09 2021)  Labs: Recent Labs    05/06/17 2030  HGB 16.3*  HCT 46.0  PLT 193  CREATININE 0.79    CrCl cannot be calculated (Unknown ideal weight.).   Medical History: Past Medical History:  Diagnosis Date  . 3-vessel CAD    cardiologist -  dr Ladona Mow [facility])  . Bipolar 1 disorder (HCC)   . Chronic chest pain    controlled w/ meds and prn nitro  . Coronary artery disease   . Family history of premature CAD   . GERD (gastroesophageal reflux disease)   . History of MI (myocardial infarction)   . Hyperlipidemia   . Hypertension   . 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 S/P drug eluting coronary stent placement    X2   in 2005  . Sleep apnea    USES CPAP, PT DOES NOT KNOW SETTINGS  . SUI (stress urinary incontinence, female)      Assessment: L-sided chest pain has hx of prior CABG Starting heparin for rule out ACS SCr 0.7, cbc ok  Goal of Therapy:  Heparin level 0.3-0.7 units/ml Monitor platelets by anticoagulation protocol: Yes   Plan:  -Heparin bolus 4000 units x1 then 1000 units/hr -Daily HL, CBC -First level in 6 hours   MastersDarl Householder 05/06/2017,9:56 PM  Addendum -Initial HL is SUBtherapeutic -Increase heparin to 1200 units/hr -Check 6 hour level   Baldemar Friday 05/07/2017 6:12 AM

## 2017-05-06 NOTE — ED Notes (Signed)
ED Provider at bedside. 

## 2017-05-06 NOTE — ED Notes (Signed)
Notified triage nurse istat trop results

## 2017-05-06 NOTE — ED Provider Notes (Signed)
[facility] EMERGENCY DEPARTMENT Provider Note   CSN: [id number] Arrival date & time: 05/06/17  2015     History   Chief Complaint Chief Complaint  Patient presents with  . Chest Pain    HPI [name] is a 52 y.o. female.  HPI  Presents with CP Had some chest pain yesterday which was mild, worked third shift yesterday and had worsening symptoms throughout the night and took nitro a few times. Went home then had to put dog down and went to vet. Laid down and chest pain got no better.   Left side tightness that goes from chest into back and down left arm.  Last night was off and on, improved with nitro, but today no matter what it has not been relieved.l TOok 5-7 nitro today without relief. Thought maybe acid reflux and took  prilosec and without relief, pepto bismol, mylanta.    Pain feels similar to prior MIs.  Her regular CP normally gets better with nitro. On ranexa and usually works. The times it has not helped is when she had prior MIs.  Cardiologist Dr. Darrold Junker in [place], Dr. Barry Dienes CT surgery here.  Last cath 4-5 years.   Feels worse laying down and worse with deep breath. Mild shortness of breath. Nausea. No vomiting. No diaphoresis, feeling cool.   Still smoking.   Past Medical History:  Diagnosis Date  . 3-vessel CAD    cardiologist -  dr Ladona Mow [facility])  . Bipolar 1 disorder (HCC)   . Chronic chest pain    controlled w/ meds and prn nitro  . Coronary artery disease   . Family history of premature CAD   . GERD (gastroesophageal reflux disease)   . History of MI (myocardial infarction)   . Hyperlipidemia   . Hypertension   . 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 S/P drug eluting coronary stent placement    X2   in 2005  . Sleep apnea    USES CPAP, PT DOES NOT KNOW SETTINGS  . SUI (stress urinary incontinence, female)     Patient Active Problem List   Diagnosis Date Noted  . Bipolar disorder (HCC) 05/06/2017  . Obesity  05/06/2017  . Sleep apnea 05/06/2017  . Hyperlipidemia 05/06/2017  . NSTEMI (non-ST elevated myocardial infarction) (HCC) 05/06/2017  . Asthma 06/30/2015  . Essential (primary) hypertension 12/05/2013  . GERD (gastroesophageal reflux disease) 12/05/2013  . History of cardiac cath 12/05/2013  . S/P CABG x 3 12/05/2013  . ASCVD (arteriosclerotic cardiovascular disease) 08/24/2013  . Cardiovascular disease 08/24/2013    Past Surgical History:  Procedure Laterality Date  . CARDIAC CATHETERIZATION  08-31-2006  dr hochrein   Severe native three-vessel CAD/  Occluded vein graft to the  RCA,  LM normal,  RIMA to OM and LIMA to LAD widely patent/  slightly reduced ef with regional wall motion abnormality,  ef 55% with mild inferior hypokinesis/  continued aggressive medical management  . CARDIAC CATHETERIZATION  11-19-2009  dr Darrold Junker [facility]   Patent LIMA to LAD, patent RIMA to OM1 with occluded native vessel, occluded RCA with occluded SVG to PDA. Collaterals from LAD to PDA and OM1 present/  diaphragmatic akinesis,  ef 55%  . CARDIOVASCULAR STRESS TEST  07-01-2014  dr Lyn Hollingshead paraschos   lexus scan sestambi study--  LVEF 36%,  mild inferior scar, mild to moderate lateral wall scar without ischemia  . CORONARY ANGIOPLASTY WITH STENT PLACEMENT  oct 2005  [facility]   post CABG--  DES x2 to SVG to RCA for PDA and Posterolateral occulsion  . CORONARY ARTERY BYPASS GRAFT  09-10-2002   dr Marilu Favre [facility]   x4--  LIMA to LAD,  RIMA to OM2, SVG to PDA and POSTEROLATERAL  . CYSTO N/A 08/26/2014   Procedure: CYSTO;  Surgeon: Alfredo Martinez, MD;  Location[facility];  Service: Urology;  Laterality: N/A;  . CYSTOSCOPY N/A 10/13/2015   Procedure: CYSTOSCOPY;  Surgeon: Alfredo Martinez, MD;  Location: [facility];  Service: Urology;  Laterality: N/A;  . CYSTOURETHROPEXY/  SPARC  SLING  06-25-2009  . EXCISION LEFT ARM MASS  02-05-2007  . LAPAROSCOPIC CHOLECYSTECTOMY  04-04-2002  . PUBOVAGINAL SLING   10/13/2015   Procedure: excision and closure of vaginal sinus;  Surgeon: Alfredo Martinez, MD;  Location: [facility];  Service: Urology;;  . REMOVAL OF URINARY SLING N/A 08/26/2014   Procedure: REMOVAL OF URINARY INFECTED SLING.;  Surgeon: Alfredo Martinez, MD;  Location[facility];  Service: Urology;  Laterality: N/A;  . RESECTION VULVAR CONDYLOMATA AND LASER ABLATION VULVA AND VAGINAL CONDYLOMATA  05-06-2010  . TRANSTHORACIC ECHOCARDIOGRAM  07-01-2014   LVEF 50%,  mild MR and TR  . VAGINAL HYSTERECTOMY  10-07-2003   COMPLETE    OB History    No data available       Home Medications    Prior to Admission medications   Medication Sig Start Date End Date Taking? Authorizing Provider  aspirin EC 325 MG tablet Take 325 mg by mouth daily.   Yes [provider]  atorvastatin (LIPITOR) 20 MG tablet Take 20 mg by mouth daily.   Yes [provider]  estradiol (ESTRACE) 1 MG tablet Take 1 mg by mouth daily.   Yes [provider]  FLUoxetine (PROZAC) 20 MG capsule Take 20 mg by mouth daily.   Yes [provider]  isosorbide mononitrate (IMDUR) 60 MG 24 hr tablet Take 60 mg by mouth 2 (two) times daily.    Yes [provider]  metoprolol succinate (TOPROL-XL) 25 MG 24 hr tablet Take 25 mg by mouth daily.   Yes [provider]  nitroGLYCERIN (NITROSTAT) 0.4 MG SL tablet Place 0.4 mg under the tongue every 5 (five) minutes as needed for chest pain.   Yes [provider]  omeprazole (PRILOSEC OTC) 20 MG tablet Take 20 mg by mouth daily.   Yes [provider]  potassium chloride SA (KLOR-CON M20) 20 MEQ tablet Take 20 mEq by mouth daily.    Yes [provider]  ranolazine (RANEXA) 1000 MG SR tablet Take 1,000 mg by mouth 2 (two) times daily.   Yes [provider]  albuterol (PROVENTIL HFA;VENTOLIN HFA) 108 (90 BASE) MCG/ACT inhaler Inhale 2 puffs into the lungs every 4 (four) hours as needed for  wheezing or shortness of breath. Patient not taking: Reported on 05/06/2017 06/13/14   Horton, Mayer Masker, MD  azelastine (ASTELIN) 0.1 % nasal spray Place 2 sprays into both nostrils 2 (two) times daily. Use in each nostril as directed Patient not taking: Reported on 05/06/2017 06/30/15   Collene Gobble, MD  cyclobenzaprine (FLEXERIL) 10 MG tablet Take 1 tablet (10 mg total) by mouth 3 (three) times daily as needed for muscle spasms. Patient not taking: Reported on 05/06/2017 06/03/16   Magdalene River, PA-C  traMADol-acetaminophen (ULTRACET) 37.5-325 MG tablet Take 1-2 tablets as needed every 4-6 hours for breakthrough pain. Patient not taking: Reported on  05/06/2017 06/03/16   Magdalene River, PA-C    Family History Family History  Problem Relation Age of Onset  . Heart disease Mother   . Hyperlipidemia Mother   . Hypertension Mother   . Heart disease Maternal Grandmother   . Cancer Brother     Social History Social History   Tobacco Use  . Smoking status: Current Every Day Smoker    Packs/day: 1.00    Years: 37.00    Pack years: 37.00    Types: Cigarettes  . Smokeless tobacco: Never Used  . Tobacco comment: 1/2 TO 3/4 PPD  Substance Use Topics  . Alcohol use: Yes    Comment: RARE  . Drug use: No     Allergies   Patient has no known allergies.   Review of Systems Review of Systems  Constitutional: Positive for fatigue. Negative for diaphoresis and fever.  HENT: Negative for sore throat.   Eyes: Negative for visual disturbance.  Respiratory: Positive for shortness of breath. Negative for cough.   Cardiovascular: Positive for chest pain.  Gastrointestinal: Positive for nausea. Negative for abdominal pain and vomiting.  Genitourinary: Negative for difficulty urinating.  Musculoskeletal: Positive for neck pain. Negative for back pain.  Skin: Negative for rash.  Neurological: Negative for syncope and headaches.     Physical Exam Updated Vital Signs BP (!) 157/95    Pulse 85   Temp 97.9 F (36.6 C) (Oral)   Resp (!) 24   Ht 5\' 5"  (1.651 m)   Wt 88 kg (194 lb)   SpO2 95%   BMI 32.28 kg/m   Physical Exam  Constitutional: She is oriented to person, place, and time. She appears well-developed and well-nourished. No distress.  HENT:  Head: Normocephalic and atraumatic.  Eyes: Conjunctivae and EOM are normal.  Neck: Normal range of motion.  Cardiovascular: Normal rate, regular rhythm, normal heart sounds and intact distal pulses. Exam reveals no gallop and no friction rub.  No murmur heard. Pulmonary/Chest: Effort normal and breath sounds normal. No respiratory distress. She has no wheezes. She has no rales.  Abdominal: Soft. She exhibits no distension. There is no tenderness. There is no guarding.  Musculoskeletal: She exhibits no edema or tenderness.  Neurological: She is alert and oriented to person, place, and time.  Skin: Skin is warm and dry. No rash noted. She is not diaphoretic. No erythema.  Nursing note and vitals reviewed.    ED Treatments / Results  Labs (all labs ordered are listed, but only abnormal results are displayed) Labs Reviewed  BASIC METABOLIC PANEL - Abnormal; Notable for the following components:      Result Value   Glucose, Bld 126 (*)    All other components within normal limits  CBC - Abnormal; Notable for the following components:   WBC 11.8 (*)    Hemoglobin 16.3 (*)    All other components within normal limits  LIPID PANEL - Abnormal; Notable for the following components:   Triglycerides 187 (*)    LDL Cholesterol 117 (*)    All other components within normal limits  I-STAT TROPONIN, ED - Abnormal; Notable for the following components:   Troponin i, poc 1.06 (*)    All other components within normal limits  HEMOGLOBIN A1C  HEPARIN LEVEL (UNFRACTIONATED)  CBC  HIV ANTIBODY (ROUTINE TESTING)  BASIC METABOLIC PANEL  TROPONIN I  I-STAT BETA HCG BLOOD, ED ([facility], [facility], [facility] ONLY)    EKG  EKG  Interpretation  Date/Time:  Sunday  May 07 2017 03:30:14 EDT Ventricular Rate:  67 PR Interval:  138 QRS Duration: 109 QT Interval:  509 QTC Calculation: 538 R Axis:   46 Text Interpretation:  Age not entered, assumed to be  52 years old for purpose of ECG interpretation Sinus rhythm Probable left atrial enlargement Posterior infarct, old Abnormal T, consider ischemia, lateral leads Prolonged QT interval No significant change since last tracing Confirmed by Melene Plan [phone]) on 05/07/2017 3:39:12 AM       Radiology Dg Chest 2 View  Result Date: 05/06/2017 CLINICAL DATA:  Chest pain EXAM: CHEST - 2 VIEW COMPARISON:  06/13/2014 chest radiograph. FINDINGS: Stable configuration sternotomy wires with discontinuity in the lower most sternotomy wire. CABG clips overlie the mediastinum. Stable cardiomediastinal silhouette with normal heart size. No pneumothorax. No pleural effusion. Lungs appear clear, with no acute consolidative airspace disease and no pulmonary edema. Cholecystectomy clips are seen in the right upper quadrant of the abdomen. IMPRESSION: No active cardiopulmonary disease. Electronically Signed   By: Delbert Phenix M.D.   On: 05/06/2017 20:52    Procedures .Critical Care Performed by: Alvira Monday, MD Authorized by: Alvira Monday, MD   Critical care provider statement:    Critical care time (minutes):  30   Critical care was necessary to treat or prevent imminent or life-threatening deterioration of the following conditions:  Cardiac failure   Critical care was time spent personally by me on the following activities:  Examination of patient, discussions with consultants, ordering and performing treatments and interventions, ordering and review of laboratory studies and ordering and review of radiographic studies Comments:     NSTEMI   (including critical care time)  Medications Ordered in ED Medications  nitroGLYCERIN (NITROSTAT) SL tablet 0.4 mg (0.4 mg Sublingual  Given 05/06/17 2310)  heparin ADULT infusion 100 units/mL (25000 units/297mL sodium chloride 0.45%) (1,000 Units/hr Intravenous New Bag/Given 05/06/17 2252)  acetaminophen (TYLENOL) tablet 650 mg (not administered)  ondansetron (ZOFRAN) injection 4 mg (not administered)  aspirin EC tablet 81 mg (not administered)  atorvastatin (LIPITOR) tablet 80 mg (not administered)  estradiol (ESTRACE) tablet 1 mg (not administered)  FLUoxetine (PROZAC) capsule 20 mg (not administered)  isosorbide mononitrate (IMDUR) 24 hr tablet 60 mg (60 mg Oral Given 05/07/17 0038)  metoprolol succinate (TOPROL-XL) 24 hr tablet 25 mg (not administered)  pantoprazole (PROTONIX) EC tablet 40 mg (not administered)  potassium chloride SA (K-DUR,KLOR-CON) CR tablet 20 mEq (not administered)  ranolazine (RANEXA) 12 hr tablet 1,000 mg (not administered)  lisinopril (PRINIVIL,ZESTRIL) tablet 10 mg (not administered)  nitroGLYCERIN 50 mg in dextrose 5 % 250 mL (0.2 mg/mL) infusion (30 mcg/min Intravenous Rate/Dose Change 05/07/17 0331)  morphine 4 MG/ML injection 4 mg (not administered)  aspirin chewable tablet 324 mg (324 mg Oral Given 05/06/17 2207)  morphine 4 MG/ML injection 4 mg (4 mg Intravenous Given 05/06/17 2215)  heparin bolus via infusion 4,000 Units (4,000 Units Intravenous Bolus from Bag 05/06/17 2253)     Initial Impression / Assessment and Plan / ED Course  I have reviewed the triage vital signs and the nursing notes.  Pertinent labs & imaging results that were available during my care of the patient were reviewed by me and considered in my medical decision making (see chart for details).     52 year old female with a history of coronary artery disease, with history of coronary artery bypass graft in 2004, prior stent placement, history of bipolar disorder, hypertension, and hyperlipidemia, presents with concern for chest pain that  feels similar to her prior heart attacks.  EKG was evaluated by me and shows some lateral  T wave changes.  Patient does take estradiol, but has no history of recent travel, no asymmetric leg swelling, no tachypnea, no hypoxia, and reports that the symptoms are the same as her anginal symptoms, and have low suspicion clinically for pulmonary embolus.  She has good pulses in her upper and lower extremities, low suspicion for aortic dissection.  Chest x-ray done shows no evidence of pneumothorax or pneumonia.  Troponin is elevated to 1.  Given concern for worsening of patient's chronic anginal pain which is no longer relieved by nitroglycerin, pain typical of her coronary artery disease pain, elevated troponin, as well as slight EKG changes, concerned that symptoms represent an STEMI.  Gave aspirin, nitroglycerin, morphine, and initiated patient on heparin drip.  Consulted cardiology for admission.   Final Clinical Impressions(s) / ED Diagnoses   Final diagnoses:  NSTEMI (non-ST elevated myocardial infarction) [facility])    ED Discharge Orders    None       Alvira Monday, MD 05/07/17 [phone]

## 2017-05-07 ENCOUNTER — Inpatient Hospital Stay (HOSPITAL_COMMUNITY): Payer: 59

## 2017-05-07 DIAGNOSIS — I214 Non-ST elevation (NSTEMI) myocardial infarction: Secondary | ICD-10-CM

## 2017-05-07 DIAGNOSIS — I501 Left ventricular failure: Secondary | ICD-10-CM

## 2017-05-07 LAB — BASIC METABOLIC PANEL
ANION GAP: 11 (ref 5–15)
BUN: 6 mg/dL (ref 6–20)
CO2: 21 mmol/L — AB (ref 22–32)
Calcium: 9 mg/dL (ref 8.9–10.3)
Chloride: 105 mmol/L (ref 101–111)
Creatinine, Ser: 0.74 mg/dL (ref 0.44–1.00)
GFR calc Af Amer: >60 mL/min (ref >60)
GFR calc non Af Amer: >60 mL/min (ref >60)
GLUCOSE: 136 mg/dL — AB (ref 65–99)
POTASSIUM: 3.3 mmol/L — AB (ref 3.5–5.1)
Sodium: 137 mmol/L (ref 135–145)

## 2017-05-07 LAB — ECHOCARDIOGRAM COMPLETE
Height: 65 in
Weight: 3104 oz

## 2017-05-07 LAB — TROPONIN I: TROPONIN I: 1.94 ng/mL — AB (ref <0.03)

## 2017-05-07 LAB — HIV ANTIBODY (ROUTINE TESTING W REFLEX): HIV Screen 4th Generation wRfx: NONREACTIVE

## 2017-05-07 LAB — CBC
HCT: 44.2 % (ref 36.0–46.0)
HEMOGLOBIN: 15.4 g/dL — AB (ref 12.0–15.0)
MCH: 32 pg (ref 26.0–34.0)
MCHC: 34.8 g/dL (ref 30.0–36.0)
MCV: 91.9 fL (ref 78.0–100.0)
PLATELETS: 192 10*3/uL (ref 150–400)
RBC: 4.81 MIL/uL (ref 3.87–5.11)
RDW: 12.6 % (ref 11.5–15.5)
WBC: 9.4 10*3/uL (ref 4.0–10.5)

## 2017-05-07 LAB — HEPARIN LEVEL (UNFRACTIONATED)
Heparin Unfractionated: 0.27 IU/mL — ABNORMAL LOW (ref 0.30–0.70)
Heparin Unfractionated: 0.45 IU/mL (ref 0.30–0.70)

## 2017-05-07 LAB — HEMOGLOBIN A1C
Hgb A1c MFr Bld: 5.3 % (ref 4.8–5.6)
Mean Plasma Glucose: 105.41 mg/dL

## 2017-05-07 LAB — MRSA PCR SCREENING: MRSA by PCR: NEGATIVE

## 2017-05-07 MED ORDER — MORPHINE SULFATE (PF) 4 MG/ML IV SOLN
4.0000 mg | Freq: Once | INTRAVENOUS | Status: AC
  Administered 2017-05-07: 4 mg via INTRAVENOUS
  Filled 2017-05-07: qty 1

## 2017-05-07 MED ORDER — ISOSORBIDE MONONITRATE ER 60 MG PO TB24: 240.0000 mg | ORAL_TABLET | Freq: Every day | ORAL | Status: DC

## 2017-05-07 MED ORDER — NITROGLYCERIN IN D5W 200-5 MCG/ML-% IV SOLN
2.0000 ug/min | INTRAVENOUS | Status: DC
  Administered 2017-05-07: 10 ug/min via INTRAVENOUS
  Administered 2017-05-08: 5 ug/min via INTRAVENOUS
  Filled 2017-05-07 (×2): qty 250

## 2017-05-07 NOTE — ED Notes (Signed)
Nitro increased

## 2017-05-07 NOTE — ED Notes (Signed)
Nasal 02 at 2 

## 2017-05-07 NOTE — ED Notes (Signed)
Paged cardiology about pt having increased chest pain after starting the nitro drip. Advised to bump up the nitro to 75 mcg and give 4mg  of morphine.

## 2017-05-07 NOTE — ED Notes (Signed)
Pt states blood was collected abt 15 minutes ago.  I will check with other phleb.

## 2017-05-07 NOTE — ED Notes (Signed)
Pain is better 

## 2017-05-07 NOTE — ED Notes (Signed)
Pr droswy

## 2017-05-07 NOTE — ED Notes (Signed)
The pt reports that her chest pain has increased

## 2017-05-07 NOTE — ED Notes (Signed)
P[t s pain is better she has slept for awhile for the first time tonight

## 2017-05-07 NOTE — ED Notes (Signed)
Pt transported to vascular. This RN introduced self to pt and informed pt she will be transported back to E39 after vascular study

## 2017-05-07 NOTE — ED Notes (Signed)
Pain and nausea medicine given

## 2017-05-07 NOTE — Progress Notes (Signed)
2D Echocardiogram has been performed.  Pieter Partridge 05/07/2017, 9:53 AM

## 2017-05-07 NOTE — Progress Notes (Signed)
ANTICOAGULATION CONSULT NOTE - Follow Up Consult  Pharmacy Consult for heparin Indication: chest pain/ACS   Patient Measurements: Height: 5\' 5"  (165.1 cm) Weight: 183 lb 6.8 oz (83.2 kg) IBW/kg (Calculated) : 57 Heparin Dosing Weight: 76.3 kg  Vital Signs: Temp: 98.2 F (36.8 C) (03/10 1204) Temp Source: Oral (03/10 1204) BP: 142/84 (03/10 1400) Pulse Rate: 67 (03/10 1400)  Labs: Recent Labs    05/06/17 2030 05/07/17 0444 05/07/17 1229  HGB 16.3* 15.4*  --   HCT 46.0 44.2  --   PLT 193 192  --   HEPARINUNFRC  --  0.27* 0.45  CREATININE 0.79 0.74  --   TROPONINI  --  1.94*  --     Estimated Creatinine Clearance: 87.7 mL/min (by C-G formula based on SCr of 0.74 mg/dL).   Medical History: Past Medical History:  Diagnosis Date  . 3-vessel CAD    cardiologist -  dr Ladona Mow [facility])  . Bipolar 1 disorder (HCC)   . Chronic chest pain    controlled w/ meds and prn nitro  . Coronary artery disease   . Family history of premature CAD   . GERD (gastroesophageal reflux disease)   . History of MI (myocardial infarction)   . Hyperlipidemia   . Hypertension   . 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 S/P drug eluting coronary stent placement    X2   in 2005  . Sleep apnea    USES CPAP, PT DOES NOT KNOW SETTINGS  . SUI (stress urinary incontinence, female)      Assessment: L-sided chest pain has hx of prior CABG heparin for rule out ACS > plan cath tomorrow Heparin drip 1200 uts/hr HL 0.45 at goal  Goal of Therapy:  Heparin level 0.3-0.7 units/ml Monitor platelets by anticoagulation protocol: Yes   Plan:  Continue Heparin drip 1200 uts/hr  Daily HL, CBC    Leota Sauers Pharm.D. CPP, BCPS Clinical Pharmacist [phone] 05/07/2017 2:52 PM

## 2017-05-07 NOTE — Progress Notes (Signed)
Progress Note  Patient Name: [name] Date of Encounter: 05/07/2017  Primary Cardiologist:   Dr. Cassie Freer   Subjective   Still with some arm pain but improved.  She did not tolerate increased NTG IV secondary to headache  Inpatient Medications    Scheduled Meds: . aspirin EC  81 mg Oral Daily  . atorvastatin  80 mg Oral q1800  . estradiol  1 mg Oral Daily  . FLUoxetine  20 mg Oral Daily  . isosorbide mononitrate  60 mg Oral BID  . lisinopril  10 mg Oral Daily  . metoprolol succinate  25 mg Oral Daily  . pantoprazole  40 mg Oral Daily  . potassium chloride SA  20 mEq Oral Daily  . ranolazine  1,000 mg Oral BID   Continuous Infusions: . heparin 1,200 Units/hr (05/07/17 0725)  . nitroGLYCERIN 30 mcg/min (05/07/17 1026)   PRN Meds: acetaminophen, nitroGLYCERIN, ondansetron (ZOFRAN) IV   Vital Signs    Vitals:   05/07/17 1015 05/07/17 1030 05/07/17 1045 05/07/17 1100  BP: (!) 158/92 (!) 163/91 (!) 165/91 (!) 163/96  Pulse: 71 73 73 72  Resp:      Temp:      TempSrc:      SpO2: 97% 97% 97% 97%  Weight:      Height:       No intake or output data in the 24 hours ending 05/07/17 1141 Filed Weights   05/06/17 2206  Weight: 194 lb (88 kg)    Telemetry    NSR - Personally Reviewed  ECG    NA - Personally Reviewed  Physical Exam   GEN: No acute distress.   Neck: No  JVD Cardiac: RRR, no murmurs, rubs, or gallops.  Respiratory: Clear  to auscultation bilaterally. GI: Soft, nontender, non-distended  MS: No  edema; No deformity. Neuro:  Nonfocal  Psych: Normal affect   Labs    Chemistry Recent Labs  Lab 05/06/17 2030 05/07/17 0444  NA 138 137  K 3.6 3.3*  CL 103 105  CO2 23 21*  GLUCOSE 126* 136*  BUN 7 6  CREATININE 0.79 0.74  CALCIUM 9.6 9.0  GFRNONAA >60 >60  GFRAA >60 >60  ANIONGAP 12 11     Hematology Recent Labs  Lab 05/06/17 2030 05/07/17 0444  WBC 11.8* 9.4  RBC 5.02 4.81  HGB 16.3* 15.4*  HCT 46.0 44.2  MCV  91.6 91.9  MCH 32.5 32.0  MCHC 35.4 34.8  RDW 12.4 12.6  PLT 193 192    Cardiac Enzymes Recent Labs  Lab 05/07/17 0444  TROPONINI 1.94*    Recent Labs  Lab 05/06/17 2052  TROPIPOC 1.06*     BNPNo results for input(s): BNP, PROBNP in the last 168 hours.   DDimer No results for input(s): DDIMER in the last 168 hours.   Radiology    Dg Chest 2 View  Result Date: 05/06/2017 CLINICAL DATA:  Chest pain EXAM: CHEST - 2 VIEW COMPARISON:  06/13/2014 chest radiograph. FINDINGS: Stable configuration sternotomy wires with discontinuity in the lower most sternotomy wire. CABG clips overlie the mediastinum. Stable cardiomediastinal silhouette with normal heart size. No pneumothorax. No pleural effusion. Lungs appear clear, with no acute consolidative airspace disease and no pulmonary edema. Cholecystectomy clips are seen in the right upper quadrant of the abdomen. IMPRESSION: No active cardiopulmonary disease. Electronically Signed   By: Delbert Phenix M.D.   On: 05/06/2017 20:52    Cardiac Studies   NA  Patient  Profile     52 y.o. female  female w/ severe multivessel CAD s/p 4vCABG in 2004 (LIMA/LAD, RIMA/OM, SVG/RCA, SVG/OM; vein grafts occluded but IMA's patent as of 2012), HTN, active smoking, HLD, and chronic chest pain presents with chest pain.  Assessment & Plan    CHEST PAIN:  Troponin elevated.  Continue IV heparin.  She is on IV NTG.  However, at discharge I would suggest that she be changed from twice daily Imdur to once daily at the max dose of 240 mg.   She needs a cardiac cath.  The patient understands that risks included but are not limited to stroke (1 in 1000), death (1 in 1000), kidney failure [usually temporary] (1 in 500), bleeding (1 in 200), allergic reaction [possibly serious] (1 in 200).  The patient understands and agrees to proceed.   Orders written.   TOBACCO ABUSE:  Educated.    DYSLIPIDEMIA:   Continue Lipitor.    HTN:  BP is up and down.  Continue IV NTG and  we will adjust meds as above and as needed based on BPs this admission.     For questions or updates, please contact [facility] Please consult [email or web address] for contact info under Cardiology/STEMI.   Signed, Rollene Rotunda, MD  05/07/2017, 11:41 AM

## 2017-05-08 ENCOUNTER — Encounter (HOSPITAL_COMMUNITY)
Admission: EM | Disposition: A | Discharge status: Home / Self Care | Payer: Self-pay | Source: Home / Self Care | Attending: Internal Medicine

## 2017-05-08 HISTORY — PX: LEFT HEART CATH AND CORS/GRAFTS ANGIOGRAPHY: CATH118250

## 2017-05-08 LAB — PROTIME-INR
INR: 1.08
Prothrombin Time: 13.9 seconds (ref 11.4–15.2)

## 2017-05-08 LAB — CBC
HEMATOCRIT: 43.6 % (ref 36.0–46.0)
Hemoglobin: 15.3 g/dL — ABNORMAL HIGH (ref 12.0–15.0)
MCH: 32.6 pg (ref 26.0–34.0)
MCHC: 35.1 g/dL (ref 30.0–36.0)
MCV: 93 fL (ref 78.0–100.0)
Platelets: 181 10*3/uL (ref 150–400)
RBC: 4.69 MIL/uL (ref 3.87–5.11)
RDW: 12.7 % (ref 11.5–15.5)
WBC: 12.2 10*3/uL — AB (ref 4.0–10.5)

## 2017-05-08 LAB — BASIC METABOLIC PANEL
ANION GAP: 9 (ref 5–15)
BUN: 10 mg/dL (ref 6–20)
CALCIUM: 9 mg/dL (ref 8.9–10.3)
CO2: 25 mmol/L (ref 22–32)
Chloride: 102 mmol/L (ref 101–111)
Creatinine, Ser: 0.74 mg/dL (ref 0.44–1.00)
GFR calc Af Amer: >60 mL/min (ref >60)
GFR calc non Af Amer: >60 mL/min (ref >60)
GLUCOSE: 122 mg/dL — AB (ref 65–99)
Potassium: 3.9 mmol/L (ref 3.5–5.1)
Sodium: 136 mmol/L (ref 135–145)

## 2017-05-08 LAB — HEPARIN LEVEL (UNFRACTIONATED): Heparin Unfractionated: 0.39 IU/mL (ref 0.30–0.70)

## 2017-05-08 SURGERY — LEFT HEART CATH AND CORS/GRAFTS ANGIOGRAPHY
Anesthesia: LOCAL

## 2017-05-08 MED ORDER — MORPHINE SULFATE (PF) 2 MG/ML IV SOLN
2.0000 mg | INTRAVENOUS | Status: DC | PRN
  Administered 2017-05-08 – 2017-05-09 (×2): 2 mg via INTRAVENOUS
  Filled 2017-05-08 (×2): qty 1

## 2017-05-08 MED ORDER — SODIUM CHLORIDE 0.9 % IV SOLN: 250.0000 mL | INTRAVENOUS | Status: DC | PRN

## 2017-05-08 MED ORDER — MIDAZOLAM HCL 2 MG/2ML IJ SOLN
INTRAMUSCULAR | Status: DC | PRN
  Administered 2017-05-08: 1 mg via INTRAVENOUS

## 2017-05-08 MED ORDER — SODIUM CHLORIDE 0.9 % WEIGHT BASED INFUSION: 3.0000 mL/kg/h | INTRAVENOUS | Status: DC

## 2017-05-08 MED ORDER — ACETAMINOPHEN 325 MG PO TABS: 650.0000 mg | ORAL_TABLET | ORAL | Status: DC | PRN

## 2017-05-08 MED ORDER — ASPIRIN 81 MG PO CHEW: 81.0000 mg | CHEWABLE_TABLET | Freq: Every day | ORAL | Status: DC

## 2017-05-08 MED ORDER — LIDOCAINE HCL (PF) 1 % IJ SOLN
INTRAMUSCULAR | Status: DC | PRN
  Administered 2017-05-08: 20 mL

## 2017-05-08 MED ORDER — ONDANSETRON HCL 4 MG/2ML IJ SOLN: 4.0000 mg | Freq: Four times a day (QID) | INTRAMUSCULAR | Status: DC | PRN

## 2017-05-08 MED ORDER — SODIUM CHLORIDE 0.9% FLUSH: 3.0000 mL | Freq: Two times a day (BID) | INTRAVENOUS | Status: DC

## 2017-05-08 MED ORDER — SODIUM CHLORIDE 0.9 % IV SOLN
INTRAVENOUS | Status: AC
  Administered 2017-05-08 (×2): via INTRAVENOUS

## 2017-05-08 MED ORDER — LIDOCAINE HCL 1 % IJ SOLN
INTRAMUSCULAR | Status: AC
  Filled 2017-05-08: qty 20

## 2017-05-08 MED ORDER — FENTANYL CITRATE (PF) 100 MCG/2ML IJ SOLN
INTRAMUSCULAR | Status: DC | PRN
  Administered 2017-05-08: 25 ug via INTRAVENOUS

## 2017-05-08 MED ORDER — MIDAZOLAM HCL 2 MG/2ML IJ SOLN
INTRAMUSCULAR | Status: AC
  Filled 2017-05-08: qty 2

## 2017-05-08 MED ORDER — SODIUM CHLORIDE 0.9 % WEIGHT BASED INFUSION: 1.0000 mL/kg/h | INTRAVENOUS | Status: DC

## 2017-05-08 MED ORDER — SODIUM CHLORIDE 0.9% FLUSH
3.0000 mL | Freq: Two times a day (BID) | INTRAVENOUS | Status: DC
  Administered 2017-05-08 – 2017-05-09 (×2): 3 mL via INTRAVENOUS

## 2017-05-08 MED ORDER — FENTANYL CITRATE (PF) 100 MCG/2ML IJ SOLN
INTRAMUSCULAR | Status: AC
  Filled 2017-05-08: qty 2

## 2017-05-08 MED ORDER — IOPAMIDOL (ISOVUE-370) INJECTION 76%
INTRAVENOUS | Status: AC
  Filled 2017-05-08: qty 125

## 2017-05-08 MED ORDER — SODIUM CHLORIDE 0.9% FLUSH: 3.0000 mL | INTRAVENOUS | Status: DC | PRN

## 2017-05-08 MED ORDER — HEPARIN (PORCINE) IN NACL 2-0.9 UNIT/ML-% IJ SOLN
INTRAMUSCULAR | Status: AC
  Filled 2017-05-08: qty 500

## 2017-05-08 MED ORDER — SODIUM CHLORIDE 0.9 % WEIGHT BASED INFUSION
1.0000 mL/kg/h | INTRAVENOUS | Status: DC
  Administered 2017-05-08: 1 mL/kg/h via INTRAVENOUS

## 2017-05-08 MED ORDER — IOPAMIDOL (ISOVUE-370) INJECTION 76%
INTRAVENOUS | Status: DC | PRN
  Administered 2017-05-08: 95 mL via INTRAVENOUS

## 2017-05-08 MED ORDER — HEPARIN (PORCINE) IN NACL 2-0.9 UNIT/ML-% IJ SOLN
INTRAMUSCULAR | Status: AC | PRN
  Administered 2017-05-08 (×2): 500 mL

## 2017-05-08 MED ORDER — ATORVASTATIN CALCIUM 80 MG PO TABS: 80.0000 mg | ORAL_TABLET | Freq: Every day | ORAL | Status: DC

## 2017-05-08 MED ORDER — MORPHINE SULFATE (PF) 10 MG/ML IV SOLN: 2.0000 mg | INTRAVENOUS | Status: DC | PRN

## 2017-05-08 SURGICAL SUPPLY — 8 items
CATH INFINITI 5FR MULTPACK ANG (CATHETERS) ×2 IMPLANT
KIT HEART LEFT (KITS) ×2 IMPLANT
PACK CARDIAC CATHETERIZATION (CUSTOM PROCEDURE TRAY) ×2 IMPLANT
SHEATH AVANTI 11CM 5FR (MISCELLANEOUS) ×2 IMPLANT
SYR MEDRAD MARK V 150ML (SYRINGE) ×2 IMPLANT
TRANSDUCER W/STOPCOCK (MISCELLANEOUS) ×2 IMPLANT
TUBING CIL FLEX 10 FLL-RA (TUBING) ×2 IMPLANT
WIRE EMERALD 3MM-J .035X150CM (WIRE) ×2 IMPLANT

## 2017-05-08 NOTE — Progress Notes (Addendum)
Progress Note  Patient Name: [name] Date of Encounter: 05/08/2017  Primary Cardiologist: Dr. Cassie Freer  Subjective   Patient stated that she was feeling much improved today. The chest pain remained but is less concerning. There is decreased substernal chest pain, decreased back pain but no pain radiating into the left arm to the degree it formerly did. She was able to ambulate to the restroom without worsening pain. Patient is aware and continues to be agreeable to the plan for cardiac cath today if an opening is available.   Inpatient Medications    Scheduled Meds: . aspirin EC  81 mg Oral Daily  . atorvastatin  80 mg Oral q1800  . estradiol  1 mg Oral Daily  . FLUoxetine  20 mg Oral Daily  . lisinopril  10 mg Oral Daily  . metoprolol succinate  25 mg Oral Daily  . pantoprazole  40 mg Oral Daily  . potassium chloride SA  20 mEq Oral Daily  . ranolazine  1,000 mg Oral BID  . sodium chloride flush  3 mL Intravenous Q12H   Continuous Infusions: . sodium chloride    . [START ON 05/09/2017] sodium chloride     Followed by  . [START ON 05/09/2017] sodium chloride    . heparin 1,200 Units/hr (05/07/17 2016)  . nitroGLYCERIN 23.333 mcg/min (05/08/17 0120)   PRN Meds: sodium chloride, acetaminophen, nitroGLYCERIN, ondansetron (ZOFRAN) IV, sodium chloride flush   Vital Signs    Vitals:   05/08/17 0100 05/08/17 0340 05/08/17 0400 05/08/17 0809  BP: 107/66 110/74 111/75 121/79  Pulse: 66 66 70 66  Resp: 18 17 17 14   Temp:  98.8 F (37.1 C)  98.5 F (36.9 C)  TempSrc:  Oral  Oral  SpO2:  97%  96%  Weight:  181 lb 7 oz (82.3 kg)    Height:        Intake/Output Summary (Last 24 hours) at 05/08/2017 0830 Last data filed at 05/08/2017 0350 Gross per 24 hour  Intake 891.68 ml  Output 750 ml  Net 141.68 ml   Filed Weights   05/06/17 2206 05/07/17 1204 05/08/17 0340  Weight: 194 lb (88 kg) 183 lb 6.8 oz (83.2 kg) 181 lb 7 oz (82.3 kg)    Telemetry    NSR -  Personally Reviewed  ECG    QTc prolongation was noted, mild T-wave abnormalities inconsistent with ACS. Minor variation compared to the previous study the prior day.  - Personally Reviewed  Physical Exam   GEN: No acute distress.  Nondiaphoretic  Neck: No JVD Cardiac: RRR, no murmurs, rubs, or gallops.  Respiratory: Clear to auscultation bilaterally. GI: Soft, nontender, non-distended  MS: No edema; No deformity. Neuro:  Nonfocal  Psych: Normal affect   Labs    Chemistry Recent Labs  Lab 05/06/17 2030 05/07/17 0444 05/08/17 0146  NA 138 137 136  K 3.6 3.3* 3.9  CL 103 105 102  CO2 23 21* 25  GLUCOSE 126* 136* 122*  BUN 7 6 10   CREATININE 0.79 0.74 0.74  CALCIUM 9.6 9.0 9.0  GFRNONAA >60 >60 >60  GFRAA >60 >60 >60  ANIONGAP 12 11 9      Hematology Recent Labs  Lab 05/06/17 2030 05/07/17 0444 05/08/17 0146  WBC 11.8* 9.4 12.2*  RBC 5.02 4.81 4.69  HGB 16.3* 15.4* 15.3*  HCT 46.0 44.2 43.6  MCV 91.6 91.9 93.0  MCH 32.5 32.0 32.6  MCHC 35.4 34.8 35.1  RDW 12.4 12.6 12.7  PLT 193 192 181    Cardiac Enzymes Recent Labs  Lab 05/07/17 0444  TROPONINI 1.94*    Recent Labs  Lab 05/06/17 2052  TROPIPOC 1.06*     BNPNo results for input(s): BNP, PROBNP in the last 168 hours.   DDimer No results for input(s): DDIMER in the last 168 hours.   Radiology    Dg Chest 2 View  Result Date: 05/06/2017 CLINICAL DATA:  Chest pain EXAM: CHEST - 2 VIEW COMPARISON:  06/13/2014 chest radiograph. FINDINGS: Stable configuration sternotomy wires with discontinuity in the lower most sternotomy wire. CABG clips overlie the mediastinum. Stable cardiomediastinal silhouette with normal heart size. No pneumothorax. No pleural effusion. Lungs appear clear, with no acute consolidative airspace disease and no pulmonary edema. Cholecystectomy clips are seen in the right upper quadrant of the abdomen. IMPRESSION: No active cardiopulmonary disease. Electronically Signed   By: Delbert Phenix M.D.   On: 05/06/2017 20:52    Cardiac Studies   N/A  Patient Profile     52 y.o. female with known extensive CAD, previous four vessel CABG in 2004 with repeat caths and stenting post procedure for recurrent chest pain. Hx of HTN, HLD, current smoker, and chronic chest pain. She presented to the ED due to constant unremitting chest pain despite the utilization of her NTG which normally resolved the pain. Noted to have markedly elevated troponin levels in the ED. NSTEMI diagnosed, IV heparin initiated, with plan for cardiac cath.  Assessment & Plan   Chest Pain: NSTEMI-elevated troponin enzymes with no corresponding EKG changes associated with typical chest pain in a patient known to have severe CAD s/p CABG x4 as well as multiple risk factors including current tobacco use.  Order for cath was written as per Dr. Antoine Poche for 3/11... -Continue heparin as acute treatment -Continue atorvastatin, ASA, NTG gtt as tolerated, ranolazine  -Metoprolol succinate 25mg  daily, lisinopril 10mg  daily -Consider discontinuing estradiol therapy given the associated risk for ACS   For questions or updates, please contact [facility] Please consult [email or web address] for contact info under Cardiology/STEMI.   Please see Attending attestation/note for plan.     Signed, Lanelle Bal, MD  05/08/2017, 8:30 AM

## 2017-05-08 NOTE — Progress Notes (Signed)
ANTICOAGULATION CONSULT NOTE - Follow Up Consult  Pharmacy Consult for heparin Indication: chest pain/ACS  Patient Measurements: Height: 5\' 5"  (165.1 cm) Weight: 181 lb 7 oz (82.3 kg) IBW/kg (Calculated) : 57 Heparin Dosing Weight: 76.3 kg  Vital Signs: Temp: 98.5 F (36.9 C) (03/11 0809) Temp Source: Oral (03/11 0809) BP: 121/79 (03/11 0809) Pulse Rate: 66 (03/11 0809)  Labs: Recent Labs    05/06/17 2030 05/07/17 0444 05/07/17 1229 05/08/17 0146  HGB 16.3* 15.4*  --  15.3*  HCT 46.0 44.2  --  43.6  PLT 193 192  --  181  HEPARINUNFRC  --  0.27* 0.45 0.39  CREATININE 0.79 0.74  --  0.74  TROPONINI  --  1.94*  --   --    Estimated Creatinine Clearance: 87.1 mL/min (by C-G formula based on SCr of 0.74 mg/dL).  Medical History: Past Medical History:  Diagnosis Date  . 3-vessel CAD    cardiologist -  dr Ladona Mow [facility])  . Bipolar 1 disorder (HCC)   . Chronic chest pain    controlled w/ meds and prn nitro  . Coronary artery disease   . Family history of premature CAD   . GERD (gastroesophageal reflux disease)   . History of MI (myocardial infarction)   . Hyperlipidemia   . Hypertension   . 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 S/P drug eluting coronary stent placement    X2   in 2005  . Sleep apnea    USES CPAP, PT DOES NOT KNOW SETTINGS  . SUI (stress urinary incontinence, female)    Assessment: L-sided chest pain has hx of prior CABG heparin for rule out ACS > plan cath today Heparin drip 1200 uts/hr Heparin level 0.39 at goal CBC stable, No overt bleeding documented  Goal of Therapy:  Heparin level 0.3-0.7 units/ml Monitor platelets by anticoagulation protocol: Yes   Plan:  Continue Heparin drip 1200 uts/hr  Daily heparin level/CBC F/U need for anticoagulation post cath    Ruben Im, PharmD Clinical Pharmacist 05/08/2017 8:30 AM

## 2017-05-08 NOTE — H&P (View-Only) (Signed)
Progress Note  Patient Name: [name] Date of Encounter: 05/08/2017  Primary Cardiologist: Dr. Cassie Freer  Subjective   Patient stated that she was feeling much improved today. The chest pain remained but is less concerning. There is decreased substernal chest pain, decreased back pain but no pain radiating into the left arm to the degree it formerly did. She was able to ambulate to the restroom without worsening pain. Patient is aware and continues to be agreeable to the plan for cardiac cath today if an opening is available.   Inpatient Medications    Scheduled Meds: . aspirin EC  81 mg Oral Daily  . atorvastatin  80 mg Oral q1800  . estradiol  1 mg Oral Daily  . FLUoxetine  20 mg Oral Daily  . lisinopril  10 mg Oral Daily  . metoprolol succinate  25 mg Oral Daily  . pantoprazole  40 mg Oral Daily  . potassium chloride SA  20 mEq Oral Daily  . ranolazine  1,000 mg Oral BID  . sodium chloride flush  3 mL Intravenous Q12H   Continuous Infusions: . sodium chloride    . [START ON 05/09/2017] sodium chloride     Followed by  . [START ON 05/09/2017] sodium chloride    . heparin 1,200 Units/hr (05/07/17 2016)  . nitroGLYCERIN 23.333 mcg/min (05/08/17 0120)   PRN Meds: sodium chloride, acetaminophen, nitroGLYCERIN, ondansetron (ZOFRAN) IV, sodium chloride flush   Vital Signs    Vitals:   05/08/17 0100 05/08/17 0340 05/08/17 0400 05/08/17 0809  BP: 107/66 110/74 111/75 121/79  Pulse: 66 66 70 66  Resp: 18 17 17 14   Temp:  98.8 F (37.1 C)  98.5 F (36.9 C)  TempSrc:  Oral  Oral  SpO2:  97%  96%  Weight:  181 lb 7 oz (82.3 kg)    Height:        Intake/Output Summary (Last 24 hours) at 05/08/2017 0830 Last data filed at 05/08/2017 0350 Gross per 24 hour  Intake 891.68 ml  Output 750 ml  Net 141.68 ml   Filed Weights   05/06/17 2206 05/07/17 1204 05/08/17 0340  Weight: 194 lb (88 kg) 183 lb 6.8 oz (83.2 kg) 181 lb 7 oz (82.3 kg)    Telemetry    NSR -  Personally Reviewed  ECG    QTc prolongation was noted, mild T-wave abnormalities inconsistent with ACS. Minor variation compared to the previous study the prior day.  - Personally Reviewed  Physical Exam   GEN: No acute distress.  Nondiaphoretic  Neck: No JVD Cardiac: RRR, no murmurs, rubs, or gallops.  Respiratory: Clear to auscultation bilaterally. GI: Soft, nontender, non-distended  MS: No edema; No deformity. Neuro:  Nonfocal  Psych: Normal affect   Labs    Chemistry Recent Labs  Lab 05/06/17 2030 05/07/17 0444 05/08/17 0146  NA 138 137 136  K 3.6 3.3* 3.9  CL 103 105 102  CO2 23 21* 25  GLUCOSE 126* 136* 122*  BUN 7 6 10   CREATININE 0.79 0.74 0.74  CALCIUM 9.6 9.0 9.0  GFRNONAA >60 >60 >60  GFRAA >60 >60 >60  ANIONGAP 12 11 9      Hematology Recent Labs  Lab 05/06/17 2030 05/07/17 0444 05/08/17 0146  WBC 11.8* 9.4 12.2*  RBC 5.02 4.81 4.69  HGB 16.3* 15.4* 15.3*  HCT 46.0 44.2 43.6  MCV 91.6 91.9 93.0  MCH 32.5 32.0 32.6  MCHC 35.4 34.8 35.1  RDW 12.4 12.6 12.7  PLT 193 192 181    Cardiac Enzymes Recent Labs  Lab 05/07/17 0444  TROPONINI 1.94*    Recent Labs  Lab 05/06/17 2052  TROPIPOC 1.06*     BNPNo results for input(s): BNP, PROBNP in the last 168 hours.   DDimer No results for input(s): DDIMER in the last 168 hours.   Radiology    Dg Chest 2 View  Result Date: 05/06/2017 CLINICAL DATA:  Chest pain EXAM: CHEST - 2 VIEW COMPARISON:  06/13/2014 chest radiograph. FINDINGS: Stable configuration sternotomy wires with discontinuity in the lower most sternotomy wire. CABG clips overlie the mediastinum. Stable cardiomediastinal silhouette with normal heart size. No pneumothorax. No pleural effusion. Lungs appear clear, with no acute consolidative airspace disease and no pulmonary edema. Cholecystectomy clips are seen in the right upper quadrant of the abdomen. IMPRESSION: No active cardiopulmonary disease. Electronically Signed   By: Delbert Phenix M.D.   On: 05/06/2017 20:52    Cardiac Studies   N/A  Patient Profile     52 y.o. female with known extensive CAD, previous four vessel CABG in 2004 with repeat caths and stenting post procedure for recurrent chest pain. Hx of HTN, HLD, current smoker, and chronic chest pain. She presented to the ED due to constant unremitting chest pain despite the utilization of her NTG which normally resolved the pain. Noted to have markedly elevated troponin levels in the ED. NSTEMI diagnosed, IV heparin initiated, with plan for cardiac cath.  Assessment & Plan   Chest Pain: NSTEMI-elevated troponin enzymes with no corresponding EKG changes associated with typical chest pain in a patient known to have severe CAD s/p CABG x4 as well as multiple risk factors including current tobacco use.  Order for cath was written as per Dr. Antoine Poche for 3/11... -Continue heparin as acute treatment -Continue atorvastatin, ASA, NTG gtt as tolerated, ranolazine  -Metoprolol succinate 25mg  daily, lisinopril 10mg  daily -Consider discontinuing estradiol therapy given the associated risk for ACS   For questions or updates, please contact [facility] Please consult [email or web address] for contact info under Cardiology/STEMI.   Please see Attending attestation/note for plan.     Signed, Lanelle Bal, MD  05/08/2017, 8:30 AM

## 2017-05-08 NOTE — Progress Notes (Signed)
Site area: Right groin a 5 french arterial sheath was removed  Site Prior to Removal:  Level 0  Pressure Applied For 20 MINUTES    Bedrest Beginning at 1615p  Manual:   Yes.    Patient Status During Pull:  stable  Post Pull Groin Site:  Level 0  Post Pull Instructions Given:  Yes.    Post Pull Pulses Present:  Yes.    Dressing Applied:  Yes.    Comments: VS remain stable

## 2017-05-08 NOTE — Interval H&P Note (Signed)
Cath Lab Visit (complete for each Cath Lab visit)  Clinical Evaluation Leading to the Procedure:   ACS: Yes.    Non-ACS:    Anginal Classification: CCS III  Anti-ischemic medical therapy: Maximal Therapy (2 or more classes of medications)  Non-Invasive Test Results: No non-invasive testing performed  Prior CABG: Previous CABG      History and Physical Interval Note:  05/08/2017 2:57 PM  [name]  has presented today for surgery, with the diagnosis of NSTEMI  The various methods of treatment have been discussed with the patient and family. After consideration of risks, benefits and other options for treatment, the patient has consented to  Procedure(s): LEFT HEART CATH AND CORS/GRAFTS ANGIOGRAPHY (N/A) as a surgical intervention .  The patient's history has been reviewed, patient examined, no change in status, stable for surgery.  I have reviewed the patient's chart and labs.  Questions were answered to the patient's satisfaction.     Nanetta Batty

## 2017-05-08 NOTE — Progress Notes (Signed)
Pt.s Family is requesting for MD to come talk to them about heart cath. Called Cath lab, Dr. Allyson Sabal is in a procedure and will be given the message.

## 2017-05-09 ENCOUNTER — Encounter (HOSPITAL_COMMUNITY): Payer: Self-pay | Admitting: Cardiovascular Disease

## 2017-05-09 LAB — BASIC METABOLIC PANEL
ANION GAP: 7 (ref 5–15)
BUN: 13 mg/dL (ref 6–20)
CALCIUM: 8.8 mg/dL — AB (ref 8.9–10.3)
CO2: 24 mmol/L (ref 22–32)
Chloride: 106 mmol/L (ref 101–111)
Creatinine, Ser: 0.82 mg/dL (ref 0.44–1.00)
GFR calc Af Amer: >60 mL/min (ref >60)
Glucose, Bld: 123 mg/dL — ABNORMAL HIGH (ref 65–99)
Potassium: 4.4 mmol/L (ref 3.5–5.1)
Sodium: 137 mmol/L (ref 135–145)

## 2017-05-09 LAB — CBC
HCT: 43.7 % (ref 36.0–46.0)
Hemoglobin: 14.6 g/dL (ref 12.0–15.0)
MCH: 31.3 pg (ref 26.0–34.0)
MCHC: 33.4 g/dL (ref 30.0–36.0)
MCV: 93.6 fL (ref 78.0–100.0)
Platelets: 169 10*3/uL (ref 150–400)
RBC: 4.67 MIL/uL (ref 3.87–5.11)
RDW: 12.5 % (ref 11.5–15.5)
WBC: 8.8 10*3/uL (ref 4.0–10.5)

## 2017-05-09 LAB — POCT ACTIVATED CLOTTING TIME: Activated Clotting Time: 169 seconds

## 2017-05-09 MED ORDER — AMLODIPINE BESYLATE 2.5 MG PO TABS
2.5000 mg | ORAL_TABLET | Freq: Every day | ORAL | Status: DC
  Administered 2017-05-09 – 2017-05-10 (×2): 2.5 mg via ORAL
  Filled 2017-05-09 (×2): qty 1

## 2017-05-09 MED ORDER — ISOSORBIDE MONONITRATE ER 30 MG PO TB24
30.0000 mg | ORAL_TABLET | Freq: Every day | ORAL | Status: DC
  Administered 2017-05-09 – 2017-05-10 (×2): 30 mg via ORAL
  Filled 2017-05-09 (×2): qty 1

## 2017-05-09 MED FILL — Heparin Sodium (Porcine) 2 Unit/ML in Sodium Chloride 0.9%: INTRAMUSCULAR | Qty: 1000 | Status: AC

## 2017-05-09 MED FILL — Lidocaine HCl Local Inj 1%: INTRAMUSCULAR | Qty: 20 | Status: AC

## 2017-05-09 NOTE — Progress Notes (Signed)
CARDIAC REHAB PHASE I   PRE:  Rate/Rhythm: 88 SR  BP:  Supine:   Sitting: 152/98  Standing:    SaO2: 94%RA  MODE:  Ambulation: 400 ft   POST:  Rate/Rhythm: 84 SR  BP:  Supine:   Sitting: 160/93  Standing:    SaO2: 99%RA 1035-1135 Pt was having chest discomfort of 2 on pain scale prior to walk which did not increase with walk. Pt walked 400 ft with slow steady gait. MI education completed with pt who voiced understanding. Reviewed NTG use, walking for ex but adhering to endpoints of ex, heart healthy diet given and MI restrictions. Discussed CRP 2 and will refer to [place].   Luetta Nutting, RN BSN  05/09/2017 11:33 AM

## 2017-05-09 NOTE — Progress Notes (Signed)
Progress Note  Patient Name: [name] Date of Encounter: 05/09/2017  Primary Cardiologist: Dr. Cassie Freer  Subjective   Patient was resting comfortably in her bed this am. She stated that although she tolerated her cath well the prior day she experienced at least two episodes of severe chest pain since her cath that required morphine to abort.   Inpatient Medications    Scheduled Meds: . aspirin EC  81 mg Oral Daily  . atorvastatin  80 mg Oral q1800  . estradiol  1 mg Oral Daily  . FLUoxetine  20 mg Oral Daily  . lisinopril  10 mg Oral Daily  . metoprolol succinate  25 mg Oral Daily  . pantoprazole  40 mg Oral Daily  . potassium chloride SA  20 mEq Oral Daily  . ranolazine  1,000 mg Oral BID  . sodium chloride flush  3 mL Intravenous Q12H   Continuous Infusions: . sodium chloride    . nitroGLYCERIN 10 mcg/min (05/08/17 2200)   PRN Meds: sodium chloride, acetaminophen, morphine injection, nitroGLYCERIN, ondansetron (ZOFRAN) IV, ondansetron (ZOFRAN) IV, sodium chloride flush   Vital Signs    Vitals:   05/08/17 1605 05/08/17 1942 05/08/17 2303 05/09/17 0257  BP: (!) 169/109 121/72 120/73 (!) 156/91  Pulse: 76 74 67 72  Resp: (!) 23  Temp:  98.9 F (37.2 C) 98.8 F (37.1 C) 98.6 F (37 C)  TempSrc:  Oral Oral Oral  SpO2: 94% 96% 95% 98%  Weight:    83 kg (182 lb 15.7 oz)  Height:        Intake/Output Summary (Last 24 hours) at 05/09/2017 0705 Last data filed at 05/09/2017 0301 Gross per 24 hour  Intake 152.32 ml  Output 1950 ml  Net -1797.68 ml   Filed Weights   05/07/17 1204 05/08/17 0340 05/09/17 0257  Weight: 83.2 kg (183 lb 6.8 oz) 82.3 kg (181 lb 7 oz) 83 kg (182 lb 15.7 oz)    Telemetry    NSR - Personally Reviewed  ECG    na - Personally Reviewed  Physical Exam   GEN: No acute distress.  Resting comfortably in bed. Nondiaphoretic. Neck: No JVD or lymphadenopathy  Cardiac: RRR, no murmurs, rubs, or gallops.  Respiratory:  Mild wheezing bilaterally more prominent in the upper airways. GI: Soft, nontender, non-distended  MS: No edema; No deformity. Neuro:  Nonfocal  Psych: Normal affect   Labs    Chemistry Recent Labs  Lab 05/07/17 0444 05/08/17 0146 05/09/17 0216  NA 137 136 137  K 3.3* 3.9 4.4  CL 105 102 106  CO2 21* 25 24  GLUCOSE 136* 122* 123*  BUN CREATININE 0.74 0.74 0.82  CALCIUM 9.0 9.0 8.8*  GFRNONAA >60 >60 >60  GFRAA >60 >60 >60  ANIONGAP Hematology Recent Labs  Lab 05/07/17 0444 05/08/17 0146 05/09/17 0216  WBC 9.4 12.2* 8.8  RBC 4.81 4.69 4.67  HGB 15.4* 15.3* 14.6  HCT 44.2 43.6 43.7  MCV 91.9 93.0 93.6  MCH 32.0 32.6 31.3  MCHC 34.8 35.1 33.4  RDW 12.6 12.7 12.5  PLT 192 181 169    Cardiac Enzymes Recent Labs  Lab 05/07/17 0444  TROPONINI 1.94*    Recent Labs  Lab 05/06/17 2052  TROPIPOC 1.06*     BNPNo results for input(s): BNP, PROBNP in the last 168 hours.   DDimer No results for input(s): DDIMER in the last 168  hours.   Radiology    No results found.  Cardiac Studies   Cardiac Cath 05/08/2017--> The left ventricular systolic function is normal. LV end diastolic pressure is normal. The left ventricular ejection fraction is 55-65% by visual estimate. Dist LM to Prox LAD lesion is 100% stenosed. Prox Cx lesion is 100% stenosed. Origin to Prox Graft lesion is 100% stenosed.Mid RCA lesion is 100% stenosed.  Patient Profile     52 y.o. female with known extensive CAD, previous four vessel CABG in 2004. She presented with unremitting chest pain despite NTG treatment at home. Hx of HTN, HLD, current smoker, and long standing chest pain. She was noted to have elevated troponin levels on admission w/o corrisponding EKG changes therefore she was diagnosed with an NSTEMI. Heparin was initiated and a cardiac catheterization was performed this admission.  Assessment & Plan    Chest Pain: NSTEMI-pain improved compared to preadmission  pain. Patient requiring multiple doses of morphine to reduce the intensity of the pain. Maximal medical management was recommended by the physician who performed the cardiac cath.  -Continue morphine 2mg  IV Q1 PRN -Continue heparin as acute treatment -Continue atorvastatin, ASA  -NTG gtt as tolerated for chest pain,  -Continue ranolazine  -Metoprolol succinate 25mg  daily, lisinopril 10mg  daily -Consider discontinuing estradiol therapy given the associated risk for ACS    For questions or updates, please contact [facility] Please consult [email or web address] for contact info under Cardiology/STEMI.   Please see Attending attestation/note for plan.   Signed, Lanelle Bal, MD  05/09/2017, 7:05 AM

## 2017-05-09 NOTE — Care Management Note (Signed)
Case Management Note  Patient Details  Name: [name] MRN: [id number] Date of Birth: October 18, 1965  Subjective/Objective:    Pt is s/p cardiac cath                Action/Plan:   PTA independent from home.  Pt has PCP and denied barriers with obtaining/paying for medications.  No CM needs identified prior to discharge   Expected Discharge Date:  05/09/17               Expected Discharge Plan:  Home/Self Care  In-House Referral:     Discharge planning Services  CM Consult  Post Acute Care Choice:    Choice offered to:     DME Arranged:    DME Agency:     HH Arranged:    HH Agency:     Status of Service:  Completed, signed off  If discussed at [employer] of Stay Meetings, dates discussed:    Additional Comments:  Cherylann Parr, RN 05/09/2017, 2:32 PM

## 2017-05-10 DIAGNOSIS — Z9889 Other specified postprocedural states: Secondary | ICD-10-CM

## 2017-05-10 DIAGNOSIS — Z951 Presence of aortocoronary bypass graft: Secondary | ICD-10-CM

## 2017-05-10 DIAGNOSIS — I2510 Atherosclerotic heart disease of native coronary artery without angina pectoris: Secondary | ICD-10-CM

## 2017-05-10 LAB — CBC
HEMATOCRIT: 44.1 % (ref 36.0–46.0)
Hemoglobin: 14.9 g/dL (ref 12.0–15.0)
MCH: 31.8 pg (ref 26.0–34.0)
MCHC: 33.8 g/dL (ref 30.0–36.0)
MCV: 94.2 fL (ref 78.0–100.0)
Platelets: 170 10*3/uL (ref 150–400)
RBC: 4.68 MIL/uL (ref 3.87–5.11)
RDW: 12.7 % (ref 11.5–15.5)
WBC: 7.7 10*3/uL (ref 4.0–10.5)

## 2017-05-10 LAB — BASIC METABOLIC PANEL
ANION GAP: 8 (ref 5–15)
BUN: 10 mg/dL (ref 6–20)
CO2: 25 mmol/L (ref 22–32)
Calcium: 9.4 mg/dL (ref 8.9–10.3)
Chloride: 104 mmol/L (ref 101–111)
Creatinine, Ser: 0.75 mg/dL (ref 0.44–1.00)
GFR calc Af Amer: >60 mL/min (ref >60)
GFR calc non Af Amer: >60 mL/min (ref >60)
GLUCOSE: 117 mg/dL — AB (ref 65–99)
POTASSIUM: 4.3 mmol/L (ref 3.5–5.1)
Sodium: 137 mmol/L (ref 135–145)

## 2017-05-10 MED ORDER — AMLODIPINE BESYLATE 2.5 MG PO TABS: 2.5000 mg | ORAL_TABLET | Freq: Every day | ORAL | 1 refills | Status: DC

## 2017-05-10 MED ORDER — ATORVASTATIN CALCIUM 80 MG PO TABS: 80.0000 mg | ORAL_TABLET | Freq: Every day | ORAL | 1 refills | Status: DC

## 2017-05-10 MED ORDER — ISOSORBIDE MONONITRATE ER 30 MG PO TB24: 30.0000 mg | ORAL_TABLET | Freq: Every day | ORAL | 1 refills | Status: AC

## 2017-05-10 MED ORDER — LISINOPRIL 10 MG PO TABS: 10.0000 mg | ORAL_TABLET | Freq: Every day | ORAL | 1 refills | Status: AC

## 2017-05-10 NOTE — Discharge Summary (Signed)
Discharge Summary    Patient ID: [name],  MRN: [id number], DOB/AGE: 53/23/1967 53 y.o.  Admit date: 05/06/2017 Discharge date: 05/10/2017  Primary Care Provider: Patient, No Pcp Per Primary Cardiologist: Dr. Darrold Junker  Discharge Diagnoses    Principal Problem:   NSTEMI (non-ST elevated myocardial infarction) [facility]) Active Problems:   ASCVD (arteriosclerotic cardiovascular disease)   Cardiovascular disease   Essential (primary) hypertension   History of cardiac cath   Obesity   S/P CABG x 3   Hyperlipidemia   Allergies No Known Allergies  Diagnostic Studies/Procedures   Cardiac Cath 05/08/2017--  The left ventricular systolic function is normal.  LV end diastolic pressure is normal.  The left ventricular ejection fraction is 55-65% by visual estimate.  Dist LM to Prox LAD lesion is 100% stenosed.  Prox Cx lesion is 100% stenosed.  Origin to Prox Graft lesion is 100% stenosed.  Mid RCA lesion is 100% stenosed. IMPRESSION:Ms Stokes's right and left internal mammary artery grafts were intact and patent. Her old vein graft was occluded as it wasn't 2012. She had left-to-right collaterals from LAD to distal RCA. LV function is normal except for mild high anterolateral hypokinesia. The only difference into her cath in 2012 and today was that her ostial LAD is now occluded whereas 7 years ago the LAD was occluded proximally after the first small diagonal branch. There are no other obvious culprit lesions. Medical therapy will be recommended. The sheath was removed and pressure held on the groin to achieve hemostasis. The patient left the lab in stable condition.  Echocardiogram 05/07/2017-- ------------------------------------------------------------------- Study Conclusions - Left ventricle: The cavity size was normal. Wall thickness was   increased in a pattern of mild LVH. Systolic function was normal.   The estimated ejection fraction was in the range of  55% to 60%.   Wall motion was normal; there were no regional wall motion   abnormalities. Features are consistent with a pseudonormal left   ventricular filling pattern, with concomitant abnormal relaxation   and increased filling pressure (grade 2 diastolic dysfunction). - Aortic valve: There was no stenosis. - Mitral valve: Mildly calcified annulus. Mildly calcified leaflets   . There was trivial regurgitation. - Right ventricle: The cavity size was normal. Systolic function   was normal. - Pulmonary arteries: No complete TR doppler jet so unable to   estimate PA systolic pressure. - Inferior vena cava: The vessel was normal in size. The   respirophasic diameter changes were in the normal range (>= 50%),   consistent with normal central venous pressure.  Impressions: - Normal LV size with mild LV hypertrophy. EF 55-60%. Moderate   diastolic dysfunction. Normal RV size and systolic function. No   significant valvular abnormalities. ____________   History of Present Illness     [name] is a 52 year old female with known extensive coronary artery disease, previous 4 vessel CABG in 2004 with LIMA/LAD, RIMA/OM, SVG/RCA, and SVG/OM with a grafts occluded IMA's patent is a cath in 2012.  Her past medical history is significant for hypertension, HLD, current smoker, long-standing anginal pain. She initially presented with unremitting chest pain for several days despite nitroglycerin treatment at home which typically resolves her anginal symptoms. Although she typically perseveres to the pain she will often take a sublingual mitral strength tablet 1 severe. As this did not resolve her symptoms in the face of ever-increasing pain she took several additional SL and tablets again without resolution or improvement. This prompted her to  visit the ED where she was found to have a troponin of 1.94 without corresponding EKG changes, NSTEMI was the admission diagnosis.    Hospital Course       Consultants: N/A  Following the diagnosis of NSTEMI a cardiac cath was performed which indicated intact and patent left and right mammary artery grafts with completely occluded saphenous grafts as above. There was no notable intervention indicated at that time. Maximizing medical therapy was recommended by the performing cardiologist and initiated prior to discharge. The patient tolerated the procedure well with a single episode of chest pain upon returning to her room following the cardiac cath. This was relieved by IV morphine. This pain did not reoccur. The nitroglycerin drip discontinued when the patient being placed on long-acting 30 mg of Imdur. She remained chest pain-free 36 hours and we ambulate around the room freely. She was determined to be stable for discharge and released on the fourth day following admission. She was started lisinopril and amlodipine in addition to her metoprolol and Imdur. Follow-up was scheduled for her with her primary cardiologist within 2wks at their next available appointment. Return precautions were given.  _____________  Discharge Vitals Blood pressure 129/80, pulse 66, temperature 98.6 F (37 C), temperature source Oral, resp. rate 18, height 5\' 5"  (1.651 m), weight 82.1 kg (180 lb 16 oz), SpO2 94 %.  Filed Weights   05/08/17 0340 05/09/17 0257 05/10/17 0416  Weight: 82.3 kg (181 lb 7 oz) 83 kg (182 lb 15.7 oz) 82.1 kg (180 lb 16 oz)    Labs & Radiologic Studies    CBC Recent Labs    05/09/17 0216 05/10/17 0256  WBC 8.8 7.7  HGB 14.6 14.9  HCT 43.7 44.1  MCV 93.6 94.2  PLT 169 170   Basic Metabolic Panel Recent Labs    16/10/96 0216 05/10/17 0256  NA 137 137  K 4.4 4.3  CL 106 104  CO2 24 25  GLUCOSE 123* 117*  BUN 13 10  CREATININE 0.82 0.75  CALCIUM 8.8* 9.4   Liver Function Tests No results for input(s): AST, ALT, ALKPHOS, BILITOT, PROT, ALBUMIN in the last 72 hours. No results for input(s): LIPASE, AMYLASE in the last 72  hours. Cardiac Enzymes No results for input(s): CKTOTAL, CKMB, CKMBINDEX, TROPONINI in the last 72 hours. BNP Invalid input(s): POCBNP D-Dimer No results for input(s): DDIMER in the last 72 hours. Hemoglobin A1C No results for input(s): HGBA1C in the last 72 hours. Fasting Lipid Panel No results for input(s): CHOL, HDL, LDLCALC, TRIG, CHOLHDL, LDLDIRECT in the last 72 hours. Thyroid Function Tests No results for input(s): TSH, T4TOTAL, T3FREE, THYROIDAB in the last 72 hours.  Invalid input(s): FREET3 _____________  Dg Chest 2 View  Result Date: 05/06/2017 CLINICAL DATA:  Chest pain EXAM: CHEST - 2 VIEW COMPARISON:  06/13/2014 chest radiograph. FINDINGS: Stable configuration sternotomy wires with discontinuity in the lower most sternotomy wire. CABG clips overlie the mediastinum. Stable cardiomediastinal silhouette with normal heart size. No pneumothorax. No pleural effusion. Lungs appear clear, with no acute consolidative airspace disease and no pulmonary edema. Cholecystectomy clips are seen in the right upper quadrant of the abdomen. IMPRESSION: No active cardiopulmonary disease. Electronically Signed   By: Delbert Phenix M.D.   On: 05/06/2017 20:52   Disposition   Pt is being discharged home today in good condition.  Follow-up Plans & Appointments    Follow-up Information    Paraschos, Lyn Hollingshead, MD Follow up.   Specialty:  Cardiology Contact information:  [address] [facility] [place] [address] [phone]          Discharge Instructions    Amb Referral to Cardiac Rehabilitation   Complete by:  As directed    Diagnosis:  NSTEMI   Call MD for:  persistant dizziness or light-headedness   Complete by:  As directed    Call MD for:  redness, tenderness, or signs of infection (pain, swelling, redness, odor or green/yellow discharge around incision site)   Complete by:  As directed    At the site of the catheter insertion.   Call MD for:   severe uncontrolled pain   Complete by:  As directed    Diet - low sodium heart healthy   Complete by:  As directed    Discharge instructions   Complete by:  As directed    Recommend that you refrain from vigorous activity until otherwise advised by your primary cardiologist.   Increase activity slowly   Complete by:  As directed       Discharge Medications   Allergies as of 05/10/2017   No Known Allergies     Medication List    STOP taking these medications   cyclobenzaprine 10 MG tablet Commonly known as:  FLEXERIL   estradiol 1 MG tablet Commonly known as:  ESTRACE   traMADol-acetaminophen 37.5-325 MG tablet Commonly known as:  ULTRACET     TAKE these medications   albuterol 108 (90 Base) MCG/ACT inhaler Commonly known as:  PROVENTIL HFA;VENTOLIN HFA Inhale 2 puffs into the lungs every 4 (four) hours as needed for wheezing or shortness of breath.   amLODipine 2.5 MG tablet Commonly known as:  NORVASC Take 1 tablet (2.5 mg total) by mouth daily. Start taking on:  05/11/2017   aspirin EC 325 MG tablet Take 325 mg by mouth daily.   atorvastatin 80 MG tablet Commonly known as:  LIPITOR Take 1 tablet (80 mg total) by mouth daily at 6 PM. What changed:    medication strength  how much to take  when to take this   azelastine 0.1 % nasal spray Commonly known as:  ASTELIN Place 2 sprays into both nostrils 2 (two) times daily. Use in each nostril as directed   FLUoxetine 20 MG capsule Commonly known as:  PROZAC Take 20 mg by mouth daily.   isosorbide mononitrate 30 MG 24 hr tablet Commonly known as:  IMDUR Take 1 tablet (30 mg total) by mouth daily. Start taking on:  05/11/2017 What changed:    medication strength  how much to take  when to take this   KLOR-CON M20 20 MEQ tablet Generic drug:  potassium chloride SA Take 20 mEq by mouth daily.   lisinopril 10 MG tablet Commonly known as:  PRINIVIL,ZESTRIL Take 1 tablet (10 mg total) by mouth  daily. Start taking on:  05/11/2017   metoprolol succinate 25 MG 24 hr tablet Commonly known as:  TOPROL-XL Take 25 mg by mouth daily.   nitroGLYCERIN 0.4 MG SL tablet Commonly known as:  NITROSTAT Place 0.4 mg under the tongue every 5 (five) minutes as needed for chest pain.   omeprazole 20 MG tablet Commonly known as:  PRILOSEC OTC Take 20 mg by mouth daily.   ranolazine 1000 MG SR tablet Commonly known as:  RANEXA Take 1,000 mg by mouth 2 (two) times daily.        Aspirin prescribed at discharge?  Yes High Intensity Statin Prescribed? (Lipitor 40-80mg  or Crestor 20-40mg ):  Yes Beta Blocker Prescribed? Yes For EF <40%, was ACEI/ARB Prescribed? Yes ADP Receptor Inhibitor Prescribed? (i.e. Plavix etc.-Includes Medically Managed Patients): No:  For EF <40%, Aldosterone Inhibitor Prescribed? No: N/A Was EF assessed during THIS hospitalization? Yes Was Cardiac Rehab II ordered? (Included Medically managed Patients): Yes   Outstanding Labs/Studies   N/A  Duration of Discharge Encounter   Greater than 30 minutes including physician time.  Johnnye Lana NP 05/10/2017, 9:40 AM

## 2017-05-10 NOTE — Care Management Note (Signed)
Case Management Note  Patient Details  Name: [name] MRN: [id number] Date of Birth: [birth year]  Subjective/Objective:    Pt is s/p cardiac cath                Action/Plan:   PTA independent from home.  Pt has PCP and denied barriers with obtaining/paying for medications.  No CM needs identified prior to discharge   Expected Discharge Date:  05/10/17               Expected Discharge Plan:  Home/Self Care  In-House Referral:     Discharge planning Services  CM Consult  Post Acute Care Choice:    Choice offered to:     DME Arranged:    DME Agency:     HH Arranged:    HH Agency:     Status of Service:  Completed, signed off  If discussed at [employer] of Stay Meetings, dates discussed:    Additional Comments: 05/10/2017 Pt to discharge today home.  Pt denied discharge concerns.  Pt will transport home via private vehicle.  No other CM needs determined Cherylann Parr, RN 05/10/2017, 10:32 AM

## 2017-05-10 NOTE — Progress Notes (Addendum)
Progress Note  Patient Name: [name] Date of Encounter: 05/10/2017  Primary Cardiologist: Dr. Cassie Freer  Subjective   The patient was resting her bed comfortably this morning.  She said that she rested well overnight, denied chest pain, denied nausea, vomiting, dyspnea, denied neck pain, arm pain or tingling.  Patient is anxious to go home.  Inpatient Medications    Scheduled Meds: . amLODipine  2.5 mg Oral Daily  . aspirin EC  81 mg Oral Daily  . atorvastatin  80 mg Oral q1800  . estradiol  1 mg Oral Daily  . FLUoxetine  20 mg Oral Daily  . isosorbide mononitrate  30 mg Oral Daily  . lisinopril  10 mg Oral Daily  . metoprolol succinate  25 mg Oral Daily  . pantoprazole  40 mg Oral Daily  . potassium chloride SA  20 mEq Oral Daily  . ranolazine  1,000 mg Oral BID  . sodium chloride flush  3 mL Intravenous Q12H   Continuous Infusions: . sodium chloride     PRN Meds: sodium chloride, acetaminophen, morphine injection, nitroGLYCERIN, ondansetron (ZOFRAN) IV, ondansetron (ZOFRAN) IV, sodium chloride flush   Vital Signs    Vitals:   05/09/17 1548 05/09/17 1919 05/09/17 2314 05/10/17 0416  BP: 123/68 137/84 110/76 129/80  Pulse:  68 65 66  Resp: 19 12 18 18   Temp: 98.7 F (37.1 C) 98.6 F (37 C) 98.4 F (36.9 C) 99.2 F (37.3 C)  TempSrc: Oral Oral Oral Oral  SpO2:  93% 95% 94%  Weight:    82.1 kg (180 lb 16 oz)  Height:       No intake or output data in the 24 hours ending 05/10/17 0724 Filed Weights   05/08/17 0340 05/09/17 0257 05/10/17 0416  Weight: 82.3 kg (181 lb 7 oz) 83 kg (182 lb 15.7 oz) 82.1 kg (180 lb 16 oz)    Telemetry    Normal sinus rhythm- Personally Reviewed  ECG    N/A- Personally Reviewed  Physical Exam   GEN: No acute distress.   Neck: No JVD Cardiac: RRR, no murmurs, rubs, or gallops.  Respiratory: Clear to auscultation bilaterally. GI: Soft, nontender, non-distended  MS: No edema; No deformity. Neuro:  Nonfocal    Psych: Normal affect   Labs    Chemistry Recent Labs  Lab 05/08/17 0146 05/09/17 0216 05/10/17 0256  NA 136 137 137  K 3.9 4.4 4.3  CL 102 106 104  CO2 25 24 25   GLUCOSE 122* 123* 117*  BUN 10 13 10   CREATININE 0.74 0.82 0.75  CALCIUM 9.0 8.8* 9.4  GFRNONAA >60 >60 >60  GFRAA >60 >60 >60  ANIONGAP 9 7 8      Hematology Recent Labs  Lab 05/08/17 0146 05/09/17 0216 05/10/17 0256  WBC 12.2* 8.8 7.7  RBC 4.69 4.67 4.68  HGB 15.3* 14.6 14.9  HCT 43.6 43.7 44.1  MCV 93.0 93.6 94.2  MCH 32.6 31.3 31.8  MCHC 35.1 33.4 33.8  RDW 12.7 12.5 12.7  PLT 181 169 170    Cardiac Enzymes Recent Labs  Lab 05/07/17 0444  TROPONINI 1.94*    Recent Labs  Lab 05/06/17 2052  TROPIPOC 1.06*     BNPNo results for input(s): BNP, PROBNP in the last 168 hours.   DDimer No results for input(s): DDIMER in the last 168 hours.   Radiology    No results found.  Cardiac Studies   Cardiac Cath 05/08/2017--> The left ventricular systolic function is  normal. LV end diastolic pressure is normal. The left ventricular ejection fraction is 55-65% by visual estimate. Dist LM to Prox LAD lesion is 100% stenosed. Prox Cx lesion is 100% stenosed. Origin to Prox Graft lesion is 100% stenosed.Mid RCA lesion is 100% stenosed.   Patient Profile     52 y.o. female with known extensive coronary artery disease, previous four-vessel CABG in 2004.  She initially presented with unremitting chest pain despite nitro glycerin treatment home.  History of hypertension, hyperlipidemia, current smoker, long-standing anginal pain.  Troponins were elevated without corresponding EKG changes with NSTEMI diagnosed.  Heparin therapy initiated, left heart cath was performed without intervention indicated.  Assessment & Plan    Chest pain: NSTEMI.  Patient has been without pain overnight.  This includes when ambulating to the restroom.  Plan to most likely discharge home today on maximal medical therapy. Continue  atorvastatin, ASA Nitroglycerin as needed for chest pain Continue ranolazine Continue metoprolol 25 mg long-acting Continue lisinopril  Continue IMDUR  daily Patient will need to consider discontinuing estradiol therapy given associated risk for ACS. Repeat EKG today  For questions or updates, please contact [facility] Please consult [email or web address] for contact info under Cardiology/STEMI.   Please see Attending attestation/note for plan.  Signed, Lanelle Bal, MD  05/10/2017, 7:24 AM    Agree with note by Dr. Crista Elliot.  Patient stable for discharge today.I performed cardiac catheterization which showed an occluded vein graft to the distal RCA, patent RIMA to the circumflex obtuse marginal branch and LAD. Her anatomy is stable. It's possible that her small non-STEMI was related to progression of her ostial LAD disease and an involvement of a small diagonal branch. LV function is normal. She's had no subsequent chest pain. She can follow-up with Dr. Sylvan Cheese at [facility] as an outpatient.  Runell Gess, M.D., FACP, [facility], [facility] [address] [place], [place]  [address]  [phone] 05/10/2017 10:01 AM

## 2017-05-10 NOTE — Discharge Instructions (Signed)
Please follow-up with your primary cardiologist Dr. Darrold Junker as indicated above.  If at any time between now and her follow-up appointment you were to develop recurrent unremittent chest pain, chest pressure, pain radiating into your arms, or other concerning symptom please contact the cardiologist and revisit the emergency department to local hospital.  Pleasure taking care of you.  Thank you for your visit to [facility].

## 2017-05-11 ENCOUNTER — Telehealth (HOSPITAL_COMMUNITY): Payer: Self-pay

## 2017-05-11 NOTE — Telephone Encounter (Signed)
Patients insurance is active and benefits verified through [place] - No co-pay, deductible amount of $750.00/$345.00 has been met, out of pocket amount of $3,000/$551.31 has been met, 20% co-insurance, and no pre-authorization is required. Passport/reference [id number]  Will contact patient to see if interested in CR. If interested, patient will need to complete follow up appt. Once completed, patient will be contacted for scheduling upon review by the RN Navigator.

## 2017-05-11 NOTE — Telephone Encounter (Signed)
Attempted to call patient to see if interested in the CR program - lm on vm °

## 2017-05-22 ENCOUNTER — Telehealth (HOSPITAL_COMMUNITY): Payer: Self-pay

## 2017-05-22 NOTE — Telephone Encounter (Signed)
Patient returned phone call and stated she is interested in CR program. Went over insurance with patient and patient verbally stated she understands what she is responsible for. Went over scheduling process with patient and stated she understands. Will contact patient for scheduling once follow up appt has been completed.

## 2017-05-31 ENCOUNTER — Telehealth (HOSPITAL_COMMUNITY): Payer: Self-pay

## 2017-05-31 NOTE — Telephone Encounter (Signed)
Attempted to call patient in regards to Cardiac Rehab - lm on vm °

## 2017-06-06 ENCOUNTER — Other Ambulatory Visit: Payer: Self-pay | Admitting: Internal Medicine

## 2017-06-08 ENCOUNTER — Telehealth (HOSPITAL_COMMUNITY): Payer: Self-pay

## 2017-06-08 ENCOUNTER — Encounter (HOSPITAL_COMMUNITY): Payer: Self-pay

## 2017-06-08 NOTE — Telephone Encounter (Signed)
2nd attempt to call patient in regards to Cardiac Rehab - lm on vm. Sending letter. °

## 2017-06-15 ENCOUNTER — Telehealth (HOSPITAL_COMMUNITY): Payer: Self-pay

## 2017-06-15 NOTE — Telephone Encounter (Signed)
3rd attempt to call patient in regards to Cardiac Rehab - lm on vm °

## 2017-06-29 ENCOUNTER — Other Ambulatory Visit: Payer: Self-pay | Admitting: Internal Medicine

## 2017-07-21 ENCOUNTER — Other Ambulatory Visit: Payer: Self-pay | Admitting: Internal Medicine

## 2017-07-26 ENCOUNTER — Other Ambulatory Visit: Payer: Self-pay | Admitting: Internal Medicine

## 2017-08-27 ENCOUNTER — Other Ambulatory Visit: Payer: Self-pay | Admitting: Internal Medicine

## 2017-12-30 ENCOUNTER — Other Ambulatory Visit: Payer: Self-pay | Admitting: Internal Medicine

## 2018-01-21 ENCOUNTER — Encounter (HOSPITAL_COMMUNITY): Payer: Self-pay | Admitting: Family Medicine

## 2018-01-21 ENCOUNTER — Ambulatory Visit (HOSPITAL_COMMUNITY)
Admission: EM | Admit: 2018-01-21 | Discharge: 2018-01-21 | Disposition: A | Discharge status: Home / Self Care | Payer: 59 | Attending: Family Medicine | Admitting: Family Medicine

## 2018-01-21 ENCOUNTER — Ambulatory Visit (INDEPENDENT_AMBULATORY_CARE_PROVIDER_SITE_OTHER): Payer: 59

## 2018-01-21 DIAGNOSIS — J40 Bronchitis, not specified as acute or chronic: Secondary | ICD-10-CM

## 2018-01-21 MED ORDER — ALBUTEROL SULFATE (2.5 MG/3ML) 0.083% IN NEBU: 2.5000 mg | INHALATION_SOLUTION | Freq: Four times a day (QID) | RESPIRATORY_TRACT | 12 refills | Status: DC | PRN

## 2018-01-21 MED ORDER — ALBUTEROL SULFATE HFA 108 (90 BASE) MCG/ACT IN AERS: 2.0000 | INHALATION_SPRAY | RESPIRATORY_TRACT | 0 refills | Status: DC | PRN

## 2018-01-21 MED ORDER — BENZONATATE 100 MG PO CAPS: 100.0000 mg | ORAL_CAPSULE | Freq: Three times a day (TID) | ORAL | 0 refills | Status: DC | PRN

## 2018-01-21 MED ORDER — AMOXICILLIN 875 MG PO TABS: 875.0000 mg | ORAL_TABLET | Freq: Two times a day (BID) | ORAL | 0 refills | Status: DC

## 2018-01-21 NOTE — ED Triage Notes (Signed)
C/O productive cough x 1 month without fever.

## 2018-01-21 NOTE — Discharge Instructions (Signed)
Combination of antibiotic and bronchodilator should result in resolution of symptoms over the next 4 5 days.  If you are still coughing by the end of the week, further imaging will be indicated.

## 2018-01-21 NOTE — ED Provider Notes (Signed)
[facility]    CSN: [id number] Arrival date & time: 01/21/18  1009     History   Chief Complaint Chief Complaint  Patient presents with  . Cough    HPI [name] is a 52 y.o. female.   This is an initial visit for this 52 year old woman complaining of cough.  Patient has a history of using a nebulizer but does not have any more medicine.  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  Patient is a smoker.  He is not short of breath and has had no fever.  Cough is been going on for a month.  Would like an x-ray because of her smoking history.     Past Medical History:  Diagnosis Date  . 3-vessel CAD    cardiologist -  dr [facility])  . Bipolar 1 disorder (HCC)   . Chronic chest pain    controlled w/ meds and prn nitro  . Coronary artery disease   . Family history of premature CAD   . GERD (gastroesophageal reflux disease)   . History of MI (myocardial infarction)   . Hyperlipidemia   . Hypertension   . 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 S/P drug eluting coronary stent placement    X2   in 2005  . Sleep apnea    USES CPAP, PT DOES NOT KNOW SETTINGS  . SUI (stress urinary incontinence, female)     Patient Active Problem List   Diagnosis Date Noted  . Bipolar disorder (HCC) 05/06/2017  . Obesity 05/06/2017  . Sleep apnea 05/06/2017  . Hyperlipidemia 05/06/2017  . NSTEMI (non-ST elevated myocardial infarction) (HCC) 05/06/2017  . Asthma 06/30/2015  . Essential (primary) hypertension 12/05/2013  . GERD (gastroesophageal reflux disease) 12/05/2013  . History of cardiac cath 12/05/2013  . S/P CABG x 3 12/05/2013  . ASCVD (arteriosclerotic cardiovascular disease) 08/24/2013  . Cardiovascular disease 08/24/2013    Past Surgical History:  Procedure Laterality Date  . CARDIAC CATHETERIZATION  08-31-2006  dr hochrein   Severe native three-vessel CAD/  Occluded vein graft to the  RCA,  LM  normal,  RIMA to OM and LIMA to LAD widely patent/  slightly reduced ef with regional wall motion abnormality,  ef 55% with mild inferior hypokinesis/  continued aggressive medical management  . CARDIAC CATHETERIZATION  11-19-2009  dr Darrold Junker [facility]   Patent LIMA to LAD, patent RIMA to OM1 with occluded native vessel, occluded RCA with occluded SVG to PDA. Collaterals from LAD to PDA and OM1 present/  diaphragmatic akinesis,  ef 55%  . CARDIOVASCULAR STRESS TEST  07-01-2014  dr Lyn Hollingshead [facility]   lexus scan sestambi study--  LVEF 36%,  mild inferior scar, mild to moderate lateral wall scar without ischemia  . CORONARY ANGIOPLASTY WITH STENT PLACEMENT  oct 2005   [facility]   post CABG--  DES x2 to SVG to RCA for PDA and Posterolateral occulsion  . CORONARY ARTERY BYPASS GRAFT  09-10-2002   dr Marilu Favre [facility]   x4--  LIMA to LAD,  RIMA to OM2, SVG to PDA and POSTEROLATERAL  . CYSTO N/A 08/26/2014   Procedure: CYSTO;  Surgeon: Alfredo Martinez, MD;  Location: [facility];  Service: Urology;  Laterality: N/A;  . CYSTOSCOPY N/A 10/13/2015   Procedure: CYSTOSCOPY;  Surgeon: Alfredo Martinez, MD;  Location: [facility];  Service: Urology;  Laterality: N/A;  . CYSTOURETHROPEXY/  SPARC  SLING  06-25-2009  .  EXCISION LEFT ARM MASS  02-05-2007  . LAPAROSCOPIC CHOLECYSTECTOMY  04-04-2002  . LEFT HEART CATH AND CORS/GRAFTS ANGIOGRAPHY N/A 05/08/2017   Procedure: LEFT HEART CATH AND CORS/GRAFTS ANGIOGRAPHY;  Surgeon: Runell Gess, MD;  [facility];  Service: Cardiovascular;  Laterality: N/A;  . PUBOVAGINAL SLING  10/13/2015   Procedure: excision and closure of vaginal sinus;  Surgeon: Alfredo Martinez, MD;  Location: [facility];  Service: Urology;;  . REMOVAL OF URINARY SLING N/A 08/26/2014   Procedure: REMOVAL OF URINARY INFECTED SLING.;  Surgeon: Alfredo Martinez, MD;  Location[facility];  Service: Urology;  Laterality: N/A;  . RESECTION VULVAR CONDYLOMATA AND LASER  ABLATION VULVA AND VAGINAL CONDYLOMATA  05-06-2010  . TRANSTHORACIC ECHOCARDIOGRAM  07-01-2014   LVEF 50%,  mild MR and TR  . VAGINAL HYSTERECTOMY  10-07-2003   COMPLETE    OB History   None      Home Medications    Prior to Admission medications   Medication Sig Start Date End Date Taking? Authorizing Provider  aspirin EC 325 MG tablet Take 325 mg by mouth daily.   Yes [provider]  estradiol (ESTRACE) 1 MG tablet Take 1 mg by mouth daily.   Yes [provider]  FLUoxetine (PROZAC) 20 MG capsule Take 20 mg by mouth daily.   Yes [provider]  isosorbide mononitrate (IMDUR) 30 MG 24 hr tablet Take 1 tablet (30 mg total) by mouth daily. 05/11/17  Yes Lanelle Bal, MD  lisinopril (PRINIVIL,ZESTRIL) 10 MG tablet Take 1 tablet (10 mg total) by mouth daily. 05/11/17  Yes Lanelle Bal, MD  metoprolol succinate (TOPROL-XL) 25 MG 24 hr tablet Take 25 mg by mouth daily.   Yes [provider]  omeprazole (PRILOSEC OTC) 20 MG tablet Take 20 mg by mouth daily.   Yes [provider]  potassium chloride SA (KLOR-CON M20) 20 MEQ tablet Take 20 mEq by mouth daily.    Yes [provider]  ranolazine (RANEXA) 1000 MG SR tablet Take 1,000 mg by mouth 2 (two) times daily.   Yes [provider]  albuterol (PROVENTIL HFA;VENTOLIN HFA) 108 (90 Base) MCG/ACT inhaler Inhale 2 puffs into the lungs every 4 (four) hours as needed for wheezing or shortness of breath. 01/21/18   Elvina Sidle, MD  albuterol (PROVENTIL) (2.5 MG/3ML) 0.083% nebulizer solution Take 3 mLs (2.5 mg total) by nebulization every 6 (six) hours as needed for wheezing or shortness of breath. 01/21/18   Elvina Sidle, MD  amoxicillin (AMOXIL) 875 MG tablet Take 1 tablet (875 mg total) by mouth 2 (two) times daily. 01/21/18   Elvina Sidle, MD  benzonatate (TESSALON) 100 MG capsule Take 1-2 capsules (100-200 mg total) by mouth 3 (three) times daily as needed  for cough. 01/21/18   Elvina Sidle, MD  nitroGLYCERIN (NITROSTAT) 0.4 MG SL tablet Place 0.4 mg under the tongue every 5 (five) minutes as needed for chest pain.    [provider]    Family History Family History  Problem Relation Age of Onset  . Heart disease Mother   . Hyperlipidemia Mother   . Hypertension Mother   . Heart disease Maternal Grandmother   . Cancer Brother     Social History Social History   Tobacco Use  . Smoking status: Current Every Day Smoker    Packs/day: 1.00    Years: 37.00    Pack years: 37.00    Types: Cigarettes  . Smokeless tobacco: Never Used  .  Tobacco comment: 1/2 TO 3/4 PPD  Substance Use Topics  . Alcohol use: Yes    Comment: RARE  . Drug use: No     Allergies   Patient has no known allergies.   Review of Systems Review of Systems  Respiratory: Positive for cough.   All other systems reviewed and are negative.    Physical Exam Triage Vital Signs ED Triage Vitals  Enc Vitals Group     BP      Pulse      Resp      Temp      Temp src      SpO2      Weight      Height      Head Circumference      Peak Flow      Pain Score      Pain Loc      Pain Edu?      Excl. in GC?    No data found.  Updated Vital Signs BP (!) 155/76   Pulse 63   Temp 97.9 F (36.6 C)   Resp 20   SpO2 97%   Physical Exam  Constitutional: She is oriented to person, place, and time. She appears well-developed and well-nourished.  HENT:  Right Ear: External ear normal.  Left Ear: External ear normal.  Mouth/Throat: Oropharynx is clear and moist.  Eyes: Conjunctivae are normal.  Neck: Normal range of motion. Neck supple.  Cardiovascular: Normal rate, regular rhythm and normal heart sounds.  Pulmonary/Chest: Effort normal and breath sounds normal.  Musculoskeletal: Normal range of motion.  Neurological: She is alert and oriented to person, place, and time.  Skin: Skin is warm and dry.  Psychiatric: She has a normal mood and  affect.  Nursing note and vitals reviewed.    UC Treatments / Results  Labs (all labs ordered are listed, but only abnormal results are displayed) Labs Reviewed - No data to display  EKG None  Radiology Dg Chest 2 View  Result Date: 01/21/2018 CLINICAL DATA:  Cough for 1 month. EXAM: CHEST - 2 VIEW COMPARISON:  05/06/2017 and prior radiograph FINDINGS: Mild cardiomegaly and CABG changes again noted. Mild chronic peribronchial identified. There is no evidence of focal airspace disease, pulmonary edema, suspicious pulmonary nodule/mass, pleural effusion, or pneumothorax. No acute bony abnormalities are identified. IMPRESSION: No evidence of acute cardiopulmonary disease. Mild cardiomegaly and mild chronic peribronchial thickening. Electronically Signed   By: Harmon Pier M.D.   On: 01/21/2018 10:46    Procedures Procedures (including critical care time)  Medications Ordered in UC Medications - No data to display  Initial Impression / Assessment and Plan / UC Course  I have reviewed the triage vital signs and the nursing notes.  Pertinent labs & imaging results that were available during my care of the patient were reviewed by me and considered in my medical decision making (see chart for details).    Final Clinical Impressions(s) / UC Diagnoses   Final diagnoses:  Bronchitis     Discharge Instructions     Combination of antibiotic and bronchodilator should result in resolution of symptoms over the next 4 5 days.  If you are still coughing by the end of the week, further imaging will be indicated.    ED Prescriptions    Medication Sig Dispense Auth. Provider   albuterol (PROVENTIL HFA;VENTOLIN HFA) 108 (90 Base) MCG/ACT inhaler Inhale 2 puffs into the lungs every 4 (four) hours as needed for wheezing or  shortness of breath. 1 Inhaler Elvina Sidle, MD   albuterol (PROVENTIL) (2.5 MG/3ML) 0.083% nebulizer solution Take 3 mLs (2.5 mg total) by nebulization every 6 (six)  hours as needed for wheezing or shortness of breath. 75 mL Elvina Sidle, MD   amoxicillin (AMOXIL) 875 MG tablet Take 1 tablet (875 mg total) by mouth 2 (two) times daily. 20 tablet Elvina Sidle, MD   benzonatate (TESSALON) 100 MG capsule Take 1-2 capsules (100-200 mg total) by mouth 3 (three) times daily as needed for cough. 40 capsule Elvina Sidle, MD     Controlled Substance Prescriptions [place] Controlled Substance Registry consulted? Not Applicable   Elvina Sidle, MD 01/21/18 1051

## 2018-12-04 ENCOUNTER — Other Ambulatory Visit (HOSPITAL_COMMUNITY)
Admission: RE | Admit: 2018-12-04 | Discharge: 2018-12-04 | Disposition: A | Discharge status: Home / Self Care | Payer: 59 | Source: Ambulatory Visit | Attending: Orthopedic Surgery | Admitting: Orthopedic Surgery

## 2018-12-04 DIAGNOSIS — Z01812 Encounter for preprocedural laboratory examination: Secondary | ICD-10-CM | POA: Diagnosis not present

## 2018-12-04 DIAGNOSIS — Z20828 Contact with and (suspected) exposure to other viral communicable diseases: Secondary | ICD-10-CM | POA: Diagnosis not present

## 2018-12-05 ENCOUNTER — Encounter (HOSPITAL_COMMUNITY): Payer: Self-pay

## 2018-12-05 ENCOUNTER — Encounter (HOSPITAL_COMMUNITY)
Admission: RE | Admit: 2018-12-05 | Discharge: 2018-12-05 | Disposition: A | Discharge status: Home / Self Care | Payer: 59 | Attending: Orthopedic Surgery | Admitting: Orthopedic Surgery

## 2018-12-05 ENCOUNTER — Other Ambulatory Visit: Payer: Self-pay

## 2018-12-05 DIAGNOSIS — M19041 Primary osteoarthritis, right hand: Secondary | ICD-10-CM | POA: Insufficient documentation

## 2018-12-05 DIAGNOSIS — Z01812 Encounter for preprocedural laboratory examination: Secondary | ICD-10-CM | POA: Diagnosis not present

## 2018-12-05 LAB — NOVEL CORONAVIRUS, NAA (HOSP ORDER, SEND-OUT TO REF LAB; TAT 18-24 HRS): SARS-CoV-2, NAA: NOT DETECTED

## 2018-12-05 NOTE — Progress Notes (Addendum)
[id number] [place], [place] - [address] [place] [address] Phone: [phone] Fax: [phone]      Your procedure is scheduled on Friday, October 9th, 2020.  Report to [facility] Main Entrance "A" at 10:00 A.M., and check in at the Admitting office.   Call this number if you have problems the morning of surgery:  [phone]  Call [phone] if you have any questions prior to your surgery date Monday-Friday 8am-4pm    Remember:  Do not eat after midnight the night before your surgery  You may drink clear liquids until 10:00AM the morning of your surgery.   Clear liquids allowed are: Water, Non-Citrus Juices (without pulp), Carbonated Beverages, Clear Tea, Black Coffee Only, and Gatorade    Take these medicines the morning of surgery with A SIP OF WATER : Albuterol (Proventil) inhaler - if needed Amoxicillin (Amoxil) Benzonatate (Tessalon) - if needed' Estradiol (Strace)  Fluoxetine (Prozac) Isosorbide Mononitrate Metoprolol Succinate (Toprol-XL) Nitroglycerin (Nitrostat - if needed Omeprazole (Prilosec) Ranexa  7 days prior to surgery STOP taking any Aspirin (unless otherwise instructed by your surgeon), Aleve, Naproxen, Ibuprofen, Motrin, Advil, Goody's, BC's, all herbal medications, fish oil, and all vitamins.    The Morning of Surgery  Do not wear jewelry, make-up or nail polish.  Do not wear lotions, powders, or perfumes/colognes, or deodorant  Do not shave 48 hours prior to surgery.  Men may shave face and neck.  Do not bring valuables to the hospital.  [facility] is not responsible for any belongings or valuables.  If you are a smoker, DO NOT Smoke 24 hours prior to surgery IF you wear a CPAP at night please bring your mask, tubing, and machine the morning of surgery   Remember that you must have someone to transport you home after your surgery, and remain with you for 24 hours if you are discharged the same  day.   Contacts, glasses, hearing aids, dentures or bridgework may not be worn into surgery.    Leave your suitcase in the car.  After surgery it may be brought to your room.  For patients admitted to the hospital, discharge time will be determined by your treatment team.  Patients discharged the day of surgery will not be allowed to drive home.    Special instructions:   [facility]- Preparing For Surgery  Before surgery, you can play an important role. Because skin is not sterile, your skin needs to be as free of germs as possible. You can reduce the number of germs on your skin by washing with CHG (chlorahexidine gluconate) Soap before surgery.  CHG is an antiseptic cleaner which kills germs and bonds with the skin to continue killing germs even after washing.    Oral Hygiene is also important to reduce your risk of infection.  Remember - BRUSH YOUR TEETH THE MORNING OF SURGERY WITH YOUR REGULAR TOOTHPASTE  Please do not use if you have an allergy to CHG or antibacterial soaps. If your skin becomes reddened/irritated stop using the CHG.  Do not shave (including legs and underarms) for at least 48 hours prior to first CHG shower. It is OK to shave your face.  Please follow these instructions carefully.   1. Shower the NIGHT BEFORE SURGERY and the MORNING OF SURGERY with CHG Soap.   2. If you chose to wash your hair, wash your hair first as usual with your normal shampoo.  3. After you shampoo, rinse your hair and  body thoroughly to remove the shampoo.  4. Use CHG as you would any other liquid soap. You can apply CHG directly to the skin and wash gently with a scrungie or a clean washcloth.   5. Apply the CHG Soap to your body ONLY FROM THE NECK DOWN.  Do not use on open wounds or open sores. Avoid contact with your eyes, ears, mouth and genitals (private parts). Wash Face and genitals (private parts)  with your normal soap.   6. Wash thoroughly, paying special attention to the  area where your surgery will be performed.  7. Thoroughly rinse your body with warm water from the neck down.  8. DO NOT shower/wash with your normal soap after using and rinsing off the CHG Soap.  9. Pat yourself dry with a CLEAN TOWEL.  10. Wear CLEAN PAJAMAS to bed the night before surgery, wear comfortable clothes the morning of surgery  11. Place CLEAN SHEETS on your bed the night of your first shower and DO NOT SLEEP WITH PETS.    Day of Surgery:  Do not apply any deodorants/lotions. Please shower the morning of surgery with the CHG soap  Please wear clean clothes to the hospital/surgery center.   Remember to brush your teeth WITH YOUR REGULAR TOOTHPASTE.   Please read over the following fact sheets that you were given.

## 2018-12-07 ENCOUNTER — Encounter (HOSPITAL_COMMUNITY)
Admission: RE | Disposition: A | Discharge status: Home / Self Care | Payer: Self-pay | Source: Home / Self Care | Attending: Orthopedic Surgery

## 2018-12-07 ENCOUNTER — Encounter (HOSPITAL_COMMUNITY): Payer: Self-pay

## 2018-12-07 ENCOUNTER — Observation Stay (HOSPITAL_COMMUNITY)
Admission: RE | Admit: 2018-12-07 | Discharge: 2018-12-08 | Disposition: A | Discharge status: Home / Self Care | Payer: 59 | Attending: Orthopedic Surgery | Admitting: Orthopedic Surgery

## 2018-12-07 ENCOUNTER — Ambulatory Visit (HOSPITAL_COMMUNITY): Payer: 59 | Admitting: Anesthesiology

## 2018-12-07 ENCOUNTER — Other Ambulatory Visit: Payer: Self-pay

## 2018-12-07 DIAGNOSIS — Z8249 Family history of ischemic heart disease and other diseases of the circulatory system: Secondary | ICD-10-CM | POA: Diagnosis not present

## 2018-12-07 DIAGNOSIS — Z79899 Other long term (current) drug therapy: Secondary | ICD-10-CM | POA: Diagnosis not present

## 2018-12-07 DIAGNOSIS — Z7982 Long term (current) use of aspirin: Secondary | ICD-10-CM | POA: Diagnosis not present

## 2018-12-07 DIAGNOSIS — Z955 Presence of coronary angioplasty implant and graft: Secondary | ICD-10-CM | POA: Diagnosis not present

## 2018-12-07 DIAGNOSIS — I2510 Atherosclerotic heart disease of native coronary artery without angina pectoris: Secondary | ICD-10-CM | POA: Insufficient documentation

## 2018-12-07 DIAGNOSIS — E785 Hyperlipidemia, unspecified: Secondary | ICD-10-CM | POA: Insufficient documentation

## 2018-12-07 DIAGNOSIS — G4730 Sleep apnea, unspecified: Secondary | ICD-10-CM | POA: Diagnosis not present

## 2018-12-07 DIAGNOSIS — I509 Heart failure, unspecified: Secondary | ICD-10-CM | POA: Diagnosis not present

## 2018-12-07 DIAGNOSIS — M1811 Unilateral primary osteoarthritis of first carpometacarpal joint, right hand: Secondary | ICD-10-CM | POA: Diagnosis present

## 2018-12-07 DIAGNOSIS — F17210 Nicotine dependence, cigarettes, uncomplicated: Secondary | ICD-10-CM | POA: Insufficient documentation

## 2018-12-07 DIAGNOSIS — I252 Old myocardial infarction: Secondary | ICD-10-CM | POA: Insufficient documentation

## 2018-12-07 DIAGNOSIS — M189 Osteoarthritis of first carpometacarpal joint, unspecified: Principal | ICD-10-CM | POA: Insufficient documentation

## 2018-12-07 DIAGNOSIS — I110 Hypertensive heart disease with heart failure: Secondary | ICD-10-CM | POA: Insufficient documentation

## 2018-12-07 DIAGNOSIS — J45909 Unspecified asthma, uncomplicated: Secondary | ICD-10-CM | POA: Insufficient documentation

## 2018-12-07 DIAGNOSIS — Z79890 Hormone replacement therapy (postmenopausal): Secondary | ICD-10-CM | POA: Insufficient documentation

## 2018-12-07 DIAGNOSIS — K219 Gastro-esophageal reflux disease without esophagitis: Secondary | ICD-10-CM | POA: Diagnosis not present

## 2018-12-07 DIAGNOSIS — Z951 Presence of aortocoronary bypass graft: Secondary | ICD-10-CM | POA: Insufficient documentation

## 2018-12-07 HISTORY — PX: CARPOMETACARPAL (CMC) FUSION OF THUMB: SHX6290

## 2018-12-07 LAB — BASIC METABOLIC PANEL
Anion gap: 12 (ref 5–15)
BUN: 6 mg/dL (ref 6–20)
CO2: 21 mmol/L — ABNORMAL LOW (ref 22–32)
Calcium: 9.5 mg/dL (ref 8.9–10.3)
Chloride: 106 mmol/L (ref 98–111)
Creatinine, Ser: 0.62 mg/dL (ref 0.44–1.00)
GFR calc Af Amer: >60 mL/min (ref >60)
GFR calc non Af Amer: >60 mL/min (ref >60)
Glucose, Bld: 119 mg/dL — ABNORMAL HIGH (ref 70–99)
Potassium: 3.9 mmol/L (ref 3.5–5.1)
Sodium: 139 mmol/L (ref 135–145)

## 2018-12-07 LAB — CBC
HCT: 44.2 % (ref 36.0–46.0)
Hemoglobin: 14.9 g/dL (ref 12.0–15.0)
MCH: 31.8 pg (ref 26.0–34.0)
MCHC: 33.7 g/dL (ref 30.0–36.0)
MCV: 94.2 fL (ref 80.0–100.0)
Platelets: 221 10*3/uL (ref 150–400)
RBC: 4.69 MIL/uL (ref 3.87–5.11)
RDW: 11.9 % (ref 11.5–15.5)
WBC: 8.8 10*3/uL (ref 4.0–10.5)
nRBC: 0 % (ref 0.0–0.2)

## 2018-12-07 SURGERY — CARPOMETACARPAL (CMC) FUSION OF THUMB
Anesthesia: Regional | Site: Thumb | Laterality: Right

## 2018-12-07 MED ORDER — HYDROMORPHONE HCL 1 MG/ML IJ SOLN
0.5000 mg | INTRAMUSCULAR | Status: DC | PRN
  Administered 2018-12-08: 1 mg via INTRAVENOUS
  Filled 2018-12-07: qty 1

## 2018-12-07 MED ORDER — CEFAZOLIN SODIUM-DEXTROSE 2-4 GM/100ML-% IV SOLN
2.0000 g | INTRAVENOUS | Status: AC
  Administered 2018-12-07: 2 g via INTRAVENOUS

## 2018-12-07 MED ORDER — LISINOPRIL 10 MG PO TABS
10.0000 mg | ORAL_TABLET | Freq: Every day | ORAL | Status: DC
  Administered 2018-12-08: 10 mg via ORAL
  Filled 2018-12-07: qty 1

## 2018-12-07 MED ORDER — ACETAMINOPHEN 325 MG PO TABS: 325.0000 mg | ORAL_TABLET | Freq: Four times a day (QID) | ORAL | Status: DC | PRN

## 2018-12-07 MED ORDER — LACTATED RINGERS IV SOLN
INTRAVENOUS | Status: DC
  Administered 2018-12-07: 11:00:00 via INTRAVENOUS

## 2018-12-07 MED ORDER — MIDAZOLAM HCL 2 MG/2ML IJ SOLN
2.0000 mg | Freq: Once | INTRAMUSCULAR | Status: AC
  Administered 2018-12-07: 13:00:00 2 mg via INTRAVENOUS

## 2018-12-07 MED ORDER — OXYCODONE HCL 5 MG PO TABS
5.0000 mg | ORAL_TABLET | ORAL | Status: DC | PRN
  Administered 2018-12-08: 5 mg via ORAL
  Filled 2018-12-07 (×2): qty 1

## 2018-12-07 MED ORDER — FENTANYL CITRATE (PF) 100 MCG/2ML IJ SOLN: 25.0000 ug | INTRAMUSCULAR | Status: DC | PRN

## 2018-12-07 MED ORDER — PANTOPRAZOLE SODIUM 40 MG PO TBEC
40.0000 mg | DELAYED_RELEASE_TABLET | Freq: Every day | ORAL | Status: DC
  Administered 2018-12-08: 40 mg via ORAL
  Filled 2018-12-07: qty 1

## 2018-12-07 MED ORDER — CHLORHEXIDINE GLUCONATE 4 % EX LIQD: 60.0000 mL | Freq: Once | CUTANEOUS | Status: DC

## 2018-12-07 MED ORDER — ONDANSETRON HCL 4 MG/2ML IJ SOLN
INTRAMUSCULAR | Status: DC | PRN
  Administered 2018-12-07: 4 mg via INTRAVENOUS

## 2018-12-07 MED ORDER — VITAMIN C 500 MG PO TABS
1000.0000 mg | ORAL_TABLET | Freq: Every day | ORAL | Status: DC
  Administered 2018-12-08: 1000 mg via ORAL
  Filled 2018-12-07: qty 2

## 2018-12-07 MED ORDER — BUPIVACAINE-EPINEPHRINE (PF) 0.5% -1:200000 IJ SOLN
INTRAMUSCULAR | Status: DC | PRN
  Administered 2018-12-07: 30 mL via PERINEURAL

## 2018-12-07 MED ORDER — 0.9 % SODIUM CHLORIDE (POUR BTL) OPTIME
TOPICAL | Status: DC | PRN
  Administered 2018-12-07: 13:00:00 1000 mL

## 2018-12-07 MED ORDER — ESTRADIOL 1 MG PO TABS
1.0000 mg | ORAL_TABLET | Freq: Every day | ORAL | Status: DC
  Administered 2018-12-08: 09:00:00 1 mg via ORAL
  Filled 2018-12-07: qty 1

## 2018-12-07 MED ORDER — CEFAZOLIN SODIUM-DEXTROSE 1-4 GM/50ML-% IV SOLN
1.0000 g | Freq: Three times a day (TID) | INTRAVENOUS | Status: DC
  Administered 2018-12-08 (×2): 1 g via INTRAVENOUS
  Filled 2018-12-07 (×2): qty 50

## 2018-12-07 MED ORDER — ONDANSETRON HCL 4 MG PO TABS: 4.0000 mg | ORAL_TABLET | Freq: Four times a day (QID) | ORAL | Status: DC | PRN

## 2018-12-07 MED ORDER — ACETAMINOPHEN 500 MG PO TABS
1000.0000 mg | ORAL_TABLET | Freq: Four times a day (QID) | ORAL | Status: AC
  Administered 2018-12-07 – 2018-12-08 (×4): 1000 mg via ORAL
  Filled 2018-12-07 (×4): qty 2

## 2018-12-07 MED ORDER — ISOSORBIDE MONONITRATE ER 30 MG PO TB24
30.0000 mg | ORAL_TABLET | Freq: Every day | ORAL | Status: DC
  Administered 2018-12-08: 30 mg via ORAL
  Filled 2018-12-07: qty 1

## 2018-12-07 MED ORDER — ASPIRIN EC 325 MG PO TBEC
325.0000 mg | DELAYED_RELEASE_TABLET | Freq: Every day | ORAL | Status: DC
  Administered 2018-12-07 – 2018-12-08 (×2): 325 mg via ORAL
  Filled 2018-12-07 (×2): qty 1

## 2018-12-07 MED ORDER — PROMETHAZINE HCL 12.5 MG RE SUPP: 12.5000 mg | Freq: Four times a day (QID) | RECTAL | Status: DC | PRN

## 2018-12-07 MED ORDER — MIDAZOLAM HCL 2 MG/2ML IJ SOLN
INTRAMUSCULAR | Status: AC
  Administered 2018-12-07: 2 mg via INTRAVENOUS
  Filled 2018-12-07: qty 2

## 2018-12-07 MED ORDER — LACTATED RINGERS IV SOLN
INTRAVENOUS | Status: DC
  Administered 2018-12-07: 18:00:00 via INTRAVENOUS

## 2018-12-07 MED ORDER — DOCUSATE SODIUM 100 MG PO CAPS
100.0000 mg | ORAL_CAPSULE | Freq: Two times a day (BID) | ORAL | Status: DC
  Administered 2018-12-07: 100 mg via ORAL
  Filled 2018-12-07 (×2): qty 1

## 2018-12-07 MED ORDER — OXYCODONE HCL 5 MG PO TABS: 5.0000 mg | ORAL_TABLET | Freq: Once | ORAL | Status: DC | PRN

## 2018-12-07 MED ORDER — ALPRAZOLAM 0.5 MG PO TABS: 0.5000 mg | ORAL_TABLET | Freq: Four times a day (QID) | ORAL | Status: DC | PRN

## 2018-12-07 MED ORDER — METHOCARBAMOL 1000 MG/10ML IJ SOLN
500.0000 mg | Freq: Four times a day (QID) | INTRAVENOUS | Status: DC | PRN
  Filled 2018-12-07: qty 5

## 2018-12-07 MED ORDER — MIDAZOLAM HCL 5 MG/5ML IJ SOLN
INTRAMUSCULAR | Status: DC | PRN
  Administered 2018-12-07: 2 mg via INTRAVENOUS

## 2018-12-07 MED ORDER — MIDAZOLAM HCL 2 MG/2ML IJ SOLN
INTRAMUSCULAR | Status: AC
  Filled 2018-12-07: qty 2

## 2018-12-07 MED ORDER — ALBUTEROL SULFATE HFA 108 (90 BASE) MCG/ACT IN AERS: 2.0000 | INHALATION_SPRAY | RESPIRATORY_TRACT | Status: DC | PRN

## 2018-12-07 MED ORDER — FENTANYL CITRATE (PF) 100 MCG/2ML IJ SOLN
INTRAMUSCULAR | Status: DC | PRN
  Administered 2018-12-07 (×2): 50 ug via INTRAVENOUS

## 2018-12-07 MED ORDER — FENTANYL CITRATE (PF) 250 MCG/5ML IJ SOLN
INTRAMUSCULAR | Status: AC
  Filled 2018-12-07: qty 5

## 2018-12-07 MED ORDER — ADULT MULTIVITAMIN W/MINERALS CH
1.0000 | ORAL_TABLET | Freq: Every day | ORAL | Status: DC
  Administered 2018-12-08: 1 via ORAL
  Filled 2018-12-07: qty 1

## 2018-12-07 MED ORDER — PROMETHAZINE HCL 25 MG/ML IJ SOLN: 6.2500 mg | INTRAMUSCULAR | Status: DC | PRN

## 2018-12-07 MED ORDER — PROPOFOL 10 MG/ML IV BOLUS
INTRAVENOUS | Status: AC
  Filled 2018-12-07: qty 20

## 2018-12-07 MED ORDER — ATORVASTATIN CALCIUM 80 MG PO TABS: 80.0000 mg | ORAL_TABLET | Freq: Every day | ORAL | Status: DC

## 2018-12-07 MED ORDER — CEFAZOLIN SODIUM-DEXTROSE 1-4 GM/50ML-% IV SOLN
1.0000 g | INTRAVENOUS | Status: AC
  Administered 2018-12-07: 1 g via INTRAVENOUS
  Filled 2018-12-07: qty 50

## 2018-12-07 MED ORDER — OMEPRAZOLE MAGNESIUM 20 MG PO TBEC: 20.0000 mg | DELAYED_RELEASE_TABLET | Freq: Every day | ORAL | Status: DC

## 2018-12-07 MED ORDER — OXYCODONE HCL 5 MG PO TABS
10.0000 mg | ORAL_TABLET | ORAL | Status: DC | PRN
  Administered 2018-12-08: 15 mg via ORAL
  Filled 2018-12-07: qty 3

## 2018-12-07 MED ORDER — NITROGLYCERIN 0.4 MG SL SUBL: 0.4000 mg | SUBLINGUAL_TABLET | SUBLINGUAL | Status: DC | PRN

## 2018-12-07 MED ORDER — METOPROLOL SUCCINATE ER 25 MG PO TB24
25.0000 mg | ORAL_TABLET | Freq: Every day | ORAL | Status: DC
  Administered 2018-12-08: 25 mg via ORAL
  Filled 2018-12-07: qty 1

## 2018-12-07 MED ORDER — FAMOTIDINE 20 MG PO TABS: 20.0000 mg | ORAL_TABLET | Freq: Two times a day (BID) | ORAL | Status: DC | PRN

## 2018-12-07 MED ORDER — RANOLAZINE ER 500 MG PO TB12
1000.0000 mg | ORAL_TABLET | Freq: Every day | ORAL | Status: DC
  Administered 2018-12-08: 09:00:00 1000 mg via ORAL
  Filled 2018-12-07: qty 2

## 2018-12-07 MED ORDER — PROPOFOL 500 MG/50ML IV EMUL
INTRAVENOUS | Status: DC | PRN
  Administered 2018-12-07: 100 ug/kg/min via INTRAVENOUS

## 2018-12-07 MED ORDER — CEFAZOLIN SODIUM-DEXTROSE 2-4 GM/100ML-% IV SOLN
INTRAVENOUS | Status: AC
  Filled 2018-12-07: qty 100

## 2018-12-07 MED ORDER — FENTANYL CITRATE (PF) 100 MCG/2ML IJ SOLN
50.0000 ug | Freq: Once | INTRAMUSCULAR | Status: AC
  Administered 2018-12-07: 13:00:00 50 ug via INTRAVENOUS

## 2018-12-07 MED ORDER — OXYCODONE HCL 5 MG/5ML PO SOLN: 5.0000 mg | Freq: Once | ORAL | Status: DC | PRN

## 2018-12-07 MED ORDER — FENTANYL CITRATE (PF) 100 MCG/2ML IJ SOLN
INTRAMUSCULAR | Status: AC
  Administered 2018-12-07: 50 ug via INTRAVENOUS
  Filled 2018-12-07: qty 2

## 2018-12-07 MED ORDER — ONDANSETRON HCL 4 MG/2ML IJ SOLN: 4.0000 mg | Freq: Four times a day (QID) | INTRAMUSCULAR | Status: DC | PRN

## 2018-12-07 MED ORDER — METHOCARBAMOL 500 MG PO TABS
500.0000 mg | ORAL_TABLET | Freq: Four times a day (QID) | ORAL | Status: DC | PRN
  Administered 2018-12-08 (×2): 500 mg via ORAL
  Filled 2018-12-07 (×2): qty 1

## 2018-12-07 MED ORDER — ALBUTEROL SULFATE (2.5 MG/3ML) 0.083% IN NEBU: 2.5000 mg | INHALATION_SOLUTION | Freq: Four times a day (QID) | RESPIRATORY_TRACT | Status: DC | PRN

## 2018-12-07 SURGICAL SUPPLY — 54 items
Arthrex DX Swivelock ×3 IMPLANT
BNDG CMPR 9X4 STRL LF SNTH (GAUZE/BANDAGES/DRESSINGS) ×1
BNDG ELASTIC 3X5.8 VLCR STR LF (GAUZE/BANDAGES/DRESSINGS) ×3 IMPLANT
BNDG ELASTIC 4X5.8 VLCR STR LF (GAUZE/BANDAGES/DRESSINGS) ×3 IMPLANT
BNDG ESMARK 4X9 LF (GAUZE/BANDAGES/DRESSINGS) ×3 IMPLANT
BNDG GAUZE ELAST 4 BULKY (GAUZE/BANDAGES/DRESSINGS) ×6 IMPLANT
CAP PIN ORTHO PINK (CAP) IMPLANT
CORD BIPOLAR FORCEPS 12FT (ELECTRODE) ×3 IMPLANT
COVER SURGICAL LIGHT HANDLE (MISCELLANEOUS) ×3 IMPLANT
COVER WAND RF STERILE (DRAPES) IMPLANT
CUFF TOURN SGL QUICK 18X4 (TOURNIQUET CUFF) ×3 IMPLANT
CUFF TOURN SGL QUICK 24 (TOURNIQUET CUFF)
CUFF TRNQT CYL 24X4X16.5-23 (TOURNIQUET CUFF) IMPLANT
DRAPE OEC MINIVIEW 54X84 (DRAPES) ×3 IMPLANT
DRSG ADAPTIC 3X8 NADH LF (GAUZE/BANDAGES/DRESSINGS) ×3 IMPLANT
DRSG EMULSION OIL 3X3 NADH (GAUZE/BANDAGES/DRESSINGS) IMPLANT
FIBERLOOP 2 0 (SUTURE) ×6 IMPLANT
GAUZE SPONGE 4X4 12PLY STRL (GAUZE/BANDAGES/DRESSINGS) ×3 IMPLANT
GAUZE XEROFORM 5X9 LF (GAUZE/BANDAGES/DRESSINGS) ×3 IMPLANT
GLOVE BIOGEL M 8.0 STRL (GLOVE) IMPLANT
GLOVE ECLIPSE 6.5 STRL STRAW (GLOVE) ×3 IMPLANT
GLOVE ECLIPSE 7.5 STRL STRAW (GLOVE) ×6 IMPLANT
GLOVE SS BIOGEL STRL SZ 8 (GLOVE) IMPLANT
GLOVE SUPERSENSE BIOGEL SZ 8 (GLOVE)
GOWN STRL REUS W/ TWL LRG LVL3 (GOWN DISPOSABLE) ×3 IMPLANT
GOWN STRL REUS W/ TWL XL LVL3 (GOWN DISPOSABLE) ×1 IMPLANT
GOWN STRL REUS W/TWL LRG LVL3 (GOWN DISPOSABLE) ×6
GOWN STRL REUS W/TWL XL LVL3 (GOWN DISPOSABLE) ×2
IMPL SWIVELOCK 3.5X13.5 (Anchor) ×1 IMPLANT
IMPLANT SWIVELOCK 3.5X13.5 (Anchor) ×3 IMPLANT
KIT ASCP FXDISP 3X8XBTNDS (KITS) ×1 IMPLANT
KIT BASIN OR (CUSTOM PROCEDURE TRAY) ×3 IMPLANT
KIT BIO-TENODESIS 3X8 DISP (KITS) ×3
KIT TURNOVER KIT B (KITS) ×3 IMPLANT
NEEDLE HYPO 25GX1X1/2 BEV (NEEDLE) ×3 IMPLANT
NS IRRIG 1000ML POUR BTL (IV SOLUTION) ×3 IMPLANT
PACK ORTHO EXTREMITY (CUSTOM PROCEDURE TRAY) ×3 IMPLANT
PAD ARMBOARD 7.5X6 YLW CONV (MISCELLANEOUS) ×3 IMPLANT
PAD CAST 3X4 CTTN HI CHSV (CAST SUPPLIES) ×1 IMPLANT
PAD CAST 4YDX4 CTTN HI CHSV (CAST SUPPLIES) ×2 IMPLANT
PADDING CAST COTTON 3X4 STRL (CAST SUPPLIES) ×2
PADDING CAST COTTON 4X4 STRL (CAST SUPPLIES) ×6
SOL PREP POV-IOD 4OZ 10% (MISCELLANEOUS) ×3 IMPLANT
SUT FIBERWIRE 3-0 18 TAPR NDL (SUTURE) ×6
SUT PROLENE 4 0 PS 2 18 (SUTURE) ×6 IMPLANT
SUT VIC AB 3-0 FS2 27 (SUTURE) IMPLANT
SUTURE FIBERWR 3-0 18 TAPR NDL (SUTURE) ×2 IMPLANT
SYR CONTROL 10ML LL (SYRINGE) ×3 IMPLANT
TOWEL GREEN STERILE (TOWEL DISPOSABLE) ×3 IMPLANT
TOWEL GREEN STERILE FF (TOWEL DISPOSABLE) ×3 IMPLANT
TUBE CONNECTING 12'X1/4 (SUCTIONS) ×1
TUBE CONNECTING 12X1/4 (SUCTIONS) ×2 IMPLANT
UNDERPAD 30X30 (UNDERPADS AND DIAPERS) ×6 IMPLANT
WATER STERILE IRR 1000ML POUR (IV SOLUTION) ×3 IMPLANT

## 2018-12-07 NOTE — H&P (Signed)
[name] is an 53 y.o. female.   Chief Complaint: Right thumb CMC arthritis HPI: Patient presents for right thumb CMC reconstruction.  Patient understands risk and benefits of surgery and desires to proceed with the above mentioned surgical reconstruction.  We are planning surgery for your upper extremity. The risk and benefits of surgery to include risk of bleeding, infection, anesthesia,  damage to normal structures and failure of the surgery to accomplish its intended goals of relieving symptoms and restoring function have been discussed in detail. With this in mind we plan to proceed. I have specifically discussed with the patient the pre-and postoperative regime and the dos and don'ts and risk and benefits in great detail. Risk and benefits of surgery also include risk of dystrophy(CRPS), chronic nerve pain, failure of the healing process to go onto completion and other inherent risks of surgery The relavent the pathophysiology of the disease/injury process, as well as the alternatives for treatment and postoperative course of action has been discussed in great detail with the patient who desires to proceed.  We will do everything in our power to help you (the patient) restore function to the upper extremity. It is a pleasure to see this patient today.   Past Medical History:  Diagnosis Date  . 3-vessel CAD    cardiologist -  dr Ladona Mow [facility])  . Bipolar 1 disorder (HCC)   . Chronic chest pain    controlled w/ meds and prn nitro  . Coronary artery disease   . Family history of premature CAD   . GERD (gastroesophageal reflux disease)   . History of MI (myocardial infarction)   . Hyperlipidemia   . Hypertension   . Myocardial infarction (HCC)   . 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 S/P drug eluting coronary stent placement    X2   in 2005  . Sleep apnea    USES CPAP, PT DOES NOT KNOW SETTINGS  . SUI (stress urinary incontinence, female)     Past Surgical History:   Procedure Laterality Date  . CARDIAC CATHETERIZATION  08-31-2006  dr hochrein   Severe native three-vessel CAD/  Occluded vein graft to the  RCA,  LM normal,  RIMA to OM and LIMA to LAD widely patent/  slightly reduced ef with regional wall motion abnormality,  ef 55% with mild inferior hypokinesis/  continued aggressive medical management  . CARDIAC CATHETERIZATION  11-19-2009  dr Darrold Junker [facility]   Patent LIMA to LAD, patent RIMA to OM1 with occluded native vessel, occluded RCA with occluded SVG to PDA. Collaterals from LAD to PDA and OM1 present/  diaphragmatic akinesis,  ef 55%  . CARDIOVASCULAR STRESS TEST  07-01-2014  dr Lyn Hollingshead paraschos   lexus scan sestambi study--  LVEF 36%,  mild inferior scar, mild to moderate lateral wall scar without ischemia  . CORONARY ANGIOPLASTY WITH STENT PLACEMENT  oct 2005   [facility]   post CABG--  DES x2 to SVG to RCA for PDA and Posterolateral occulsion  . CORONARY ARTERY BYPASS GRAFT  09-10-2002   dr Marilu Favre [facility]   x4--  LIMA to LAD,  RIMA to OM2, SVG to PDA and POSTEROLATERAL  . CYSTO N/A 08/26/2014   Procedure: CYSTO;  Surgeon: Alfredo Martinez, MD;  Location: [facility];  Service: Urology;  Laterality: N/A;  . CYSTOSCOPY N/A 10/13/2015   Procedure: CYSTOSCOPY;  Surgeon: Alfredo Martinez, MD;  Location: [facility];  Service: Urology;  Laterality: N/A;  . CYSTOURETHROPEXY/  [place]  SLING  06-25-2009  . EXCISION LEFT ARM MASS  02-05-2007  . LAPAROSCOPIC CHOLECYSTECTOMY  04-04-2002  . LEFT HEART CATH AND CORS/GRAFTS ANGIOGRAPHY N/A 05/08/2017   Procedure: LEFT HEART CATH AND CORS/GRAFTS ANGIOGRAPHY;  Surgeon: Lorretta Harp, MD;  Location: [facility];  Service: Cardiovascular;  Laterality: N/A;  . PUBOVAGINAL SLING  10/13/2015   Procedure: excision and closure of vaginal sinus;  Surgeon: Bjorn Loser, MD;  Location: [facility];  Service: Urology;;  . REMOVAL OF URINARY SLING N/A 08/26/2014   Procedure: REMOVAL OF URINARY INFECTED SLING.;   Surgeon: Bjorn Loser, MD;  Location: [facility];  Service: Urology;  Laterality: N/A;  . RESECTION VULVAR CONDYLOMATA AND LASER ABLATION VULVA AND VAGINAL CONDYLOMATA  05-06-2010  . TRANSTHORACIC ECHOCARDIOGRAM  07-01-2014   LVEF 50%,  mild MR and TR  . VAGINAL HYSTERECTOMY  10-07-2003   COMPLETE    Family History  Problem Relation Age of Onset  . Heart disease Mother   . Hyperlipidemia Mother   . Hypertension Mother   . Heart disease Maternal Grandmother   . Cancer Brother    Social History:  reports that she has been smoking cigarettes. She has a 37.00 pack-year smoking history. She has never used smokeless tobacco. She reports current alcohol use. She reports that she does not use drugs.  Allergies: No Known Allergies  Medications Prior to Admission  Medication Sig Dispense Refill  . aspirin EC 325 MG tablet Take 325 mg by mouth daily.    Marland Kitchen atorvastatin (LIPITOR) 80 MG tablet Take 80 mg by mouth daily.     Marland Kitchen estradiol (ESTRACE) 1 MG tablet Take 1 mg by mouth daily.    . isosorbide mononitrate (IMDUR) 30 MG 24 hr tablet Take 1 tablet (30 mg total) by mouth daily. 30 tablet 1  . lisinopril (PRINIVIL,ZESTRIL) 10 MG tablet Take 1 tablet (10 mg total) by mouth daily. 30 tablet 1  . metoprolol succinate (TOPROL-XL) 25 MG 24 hr tablet Take 25 mg by mouth daily.    . Multiple Vitamin (MULTIVITAMIN WITH MINERALS) TABS tablet Take 1 tablet by mouth daily.    . nitroGLYCERIN (NITROSTAT) 0.4 MG SL tablet Place 0.4 mg under the tongue every 5 (five) minutes as needed for chest pain.    Marland Kitchen omeprazole (PRILOSEC OTC) 20 MG tablet Take 20 mg by mouth daily.    . ranolazine (RANEXA) 1000 MG SR tablet Take 1,000 mg by mouth daily.     Marland Kitchen albuterol (PROVENTIL HFA;VENTOLIN HFA) 108 (90 Base) MCG/ACT inhaler Inhale 2 puffs into the lungs every 4 (four) hours as needed for wheezing or shortness of breath. 1 Inhaler 0  . albuterol (PROVENTIL) (2.5 MG/3ML) 0.083% nebulizer solution Take  3 mLs (2.5 mg total) by nebulization every 6 (six) hours as needed for wheezing or shortness of breath. (Patient not taking: Reported on 12/05/2018) 75 mL 12  . amoxicillin (AMOXIL) 875 MG tablet Take 1 tablet (875 mg total) by mouth 2 (two) times daily. (Patient not taking: Reported on 12/05/2018) 20 tablet 0  . benzonatate (TESSALON) 100 MG capsule Take 1-2 capsules (100-200 mg total) by mouth 3 (three) times daily as needed for cough. (Patient not taking: Reported on 12/05/2018) 40 capsule 0    Results for orders placed or performed during the hospital encounter of 12/07/18 (from the past 48 hour(s))  Basic metabolic panel     Status: Abnormal   Collection Time: 12/07/18 10:44 AM  Result Value Ref Range  Sodium 139 135 - 145 mmol/L   Potassium 3.9 3.5 - 5.1 mmol/L   Chloride 106 98 - 111 mmol/L   CO2 21 (L) 22 - 32 mmol/L   Glucose, Bld 119 (H) 70 - 99 mg/dL   BUN 6 6 - 20 mg/dL   Creatinine, Ser 2.42 0.44 - 1.00 mg/dL   Calcium 9.5 8.9 - 35.3 mg/dL   GFR calc non Af Amer >60 >60 mL/min   GFR calc Af Amer >60 >60 mL/min   Anion gap 12 5 - 15    Comment: Performed at [facility], [address], [place], [place] [address]  CBC     Status: None   Collection Time: 12/07/18 10:44 AM  Result Value Ref Range   WBC 8.8 4.0 - 10.5 K/uL   RBC 4.69 3.87 - 5.11 MIL/uL   Hemoglobin 14.9 12.0 - 15.0 g/dL   HCT 15.4 00.8 - 67.6 %   MCV 94.2 80.0 - 100.0 fL   MCH 31.8 26.0 - 34.0 pg   MCHC 33.7 30.0 - 36.0 g/dL   RDW 19.5 09.3 - 26.7 %   Platelets 221 150 - 400 K/uL   nRBC 0.0 0.0 - 0.2 %    Comment: Performed at [facility], [address], [place], [place] [address]   No results found.  Review of Systems  Eyes: Negative.   Respiratory: Negative.     Blood pressure 127/75, pulse 70, temperature 98.2 F (36.8 C), temperature source Oral, resp. rate 18, height 5\' 5"  (1.651 m), weight 87.1 kg, SpO2 97 %. Physical Exam  Right thumb CMC arthritis exacerbated with activity.   No signs of infection dystrophy or vascular compromise.  Patient desires to proceed with surgical algorithm of care in the form of [facility] arthroplasty.   The patient is alert and oriented in no acute distress. The patient complains of pain in the affected upper extremity.  The patient is noted to have a normal HEENT exam. Lung fields show equal chest expansion and no shortness of breath. Abdomen exam is nontender without distention. Lower extremity examination does not show any fracture dislocation or blood clot symptoms. Pelvis is stable and the neck and back are stable and nontender.     Assessment/Plan  We are planning surgery for your upper extremity. The risk and benefits of surgery to include risk of bleeding, infection, anesthesia,  damage to normal structures and failure of the surgery to accomplish its intended goals of relieving symptoms and restoring function have been discussed in detail. With this in mind we plan to proceed. I have specifically discussed with the patient the pre-and postoperative regime and the dos and don'ts and risk and benefits in great detail. Risk and benefits of surgery also include risk of dystrophy(CRPS), chronic nerve pain, failure of the healing process to go onto completion and other inherent risks of surgery The relavent the pathophysiology of the disease/injury process, as well as the alternatives for treatment and postoperative course of action has been discussed in great detail with the patient who desires to proceed.  We will do everything in our power to help you (the patient) restore function to the upper extremity. It is a pleasure to see this patient today.   Right thumb CMC arthritis exacerbated with activity.  No signs of infection dystrophy or vascular compromise.  Patient desires to proceed with surgical algorithm of care in the form of [facility] arthroplasty.   [facility], MD 12/07/2018,  12:31 PM

## 2018-12-07 NOTE — Transfer of Care (Signed)
Immediate Anesthesia Transfer of Care Note  Patient: [name]  Procedure(s) Performed: Right thumb carpometacarpal arthroplasty with double tendon transfer and repair reconstruction as necessary (Right Thumb)  Patient Location: PACU  Anesthesia Type:MAC and Regional  Level of Consciousness: awake, alert  and oriented  Airway & Oxygen Therapy: Patient Spontanous Breathing  Post-op Assessment: Report given to RN and Post -op Vital signs reviewed and stable  Post vital signs: Reviewed and stable  Last Vitals:  Vitals Value Taken Time  BP 104/71 12/07/18 1451  Temp 36 C 12/07/18 1451  Pulse 71 12/07/18 1456  Resp 15 12/07/18 1456  SpO2 97 % 12/07/18 1456  Vitals shown include unvalidated device data.  Last Pain:  Vitals:   12/07/18 1451  TempSrc:   PainSc: 0-No pain         Complications: No apparent anesthesia complications

## 2018-12-07 NOTE — Anesthesia Preprocedure Evaluation (Addendum)
Anesthesia Evaluation  Patient identified by MRN, date of birth, ID band Patient awake    Reviewed: Allergy & Precautions, NPO status , Patient's Chart, lab work & pertinent test results, reviewed documented beta blocker date and time   History of Anesthesia Complications Negative for: history of anesthetic complications  Airway Mallampati: II  TM Distance: >3 FB Neck ROM: Full    Dental  (+) Dental Advisory Given, Teeth Intact   Pulmonary asthma , sleep apnea and Continuous Positive Airway Pressure Ventilation , Current Smoker and Patient abstained from smoking.,    Pulmonary exam normal        Cardiovascular hypertension, Pt. on home beta blockers and Pt. on medications + CAD, + Past MI, + Cardiac Stents, + CABG and +CHF (diastolic)  Normal cardiovascular exam   '19 Cath - The left ventricular systolic function is normal. LV end diastolic pressure is normal. The LVEF is 55-65% by visual estimate. Dist LM to Prox LAD lesion is 100% stenosed. Prox Cx lesion is 100% stenosed. Origin to Prox Graft lesion is 100% stenosed. Mid RCA lesion is 100% stenosed. IMPRESSION:[name]'s right and left internal mammary artery grafts were intact and patent. Her old vein graft was occluded as it wasn't 2012. She had left-to-right collaterals from LAD to distal RCA. LV function is normal except for mild high anterolateral hypokinesia. The only difference into her cath in 2012 and today was that her ostial LAD is now occluded. Medical therapy will be recommended.  '19 TTE - mild LVH. EF 55% to 60%. Grade 2 diastolic dysfunction. Trivial MR    Neuro/Psych PSYCHIATRIC DISORDERS Bipolar Disorder negative neurological ROS     GI/Hepatic Neg liver ROS, GERD  Medicated and Controlled,  Endo/Other   Obesity   Renal/GU negative Renal ROS  Female GU complaint     Musculoskeletal  (+) Arthritis ,   Abdominal   Peds   Hematology negative hematology ROS (+)   Anesthesia Other Findings   Reproductive/Obstetrics  S/p hysterectomy                             Anesthesia Physical Anesthesia Plan  ASA: III  Anesthesia Plan: Regional   Post-op Pain Management:    Induction: Intravenous  PONV Risk Score and Plan: 2 and Propofol infusion and Treatment may vary due to age or medical condition  Airway Management Planned: Natural Airway and Simple Face Mask  Additional Equipment: None  Intra-op Plan:   Post-operative Plan:   Informed Consent: I have reviewed the patients History and Physical, chart, labs and discussed the procedure including the risks, benefits and alternatives for the proposed anesthesia with the patient or authorized representative who has indicated his/her understanding and acceptance.       Plan Discussed with: CRNA and Anesthesiologist  Anesthesia Plan Comments:        Anesthesia Quick Evaluation

## 2018-12-07 NOTE — Anesthesia Postprocedure Evaluation (Signed)
Anesthesia Post Note  Patient: [name]  Procedure(s) Performed: Right thumb carpometacarpal arthroplasty with double tendon transfer and repair reconstruction as necessary (Right Thumb)     Patient location during evaluation: PACU Anesthesia Type: MAC Level of consciousness: awake and alert Pain management: pain level controlled Vital Signs Assessment: post-procedure vital signs reviewed and stable Respiratory status: spontaneous breathing, nonlabored ventilation, respiratory function stable and patient connected to nasal cannula oxygen Cardiovascular status: stable and blood pressure returned to baseline Postop Assessment: no apparent nausea or vomiting Anesthetic complications: no    Last Vitals:  Vitals:   12/07/18 1459 12/07/18 1515  BP:    Pulse: 72 65  Resp: 16 17  Temp:    SpO2: 97% 97%    Last Pain:  Vitals:   12/07/18 1515  TempSrc:   PainSc: 0-No pain                 Caria Transue S

## 2018-12-07 NOTE — Anesthesia Procedure Notes (Signed)
Anesthesia Regional Block: Supraclavicular block   Pre-Anesthetic Checklist: ,, timeout performed, Correct Patient, Correct Site, Correct Laterality, Correct Procedure, Correct Position, site marked, Risks and benefits discussed,  Surgical consent,  Pre-op evaluation,  At surgeon's request and post-op pain management  Laterality: Right  Prep: chloraprep       Needles:  Injection technique: Single-shot  Needle Type: Echogenic Needle     Needle Length: 5cm  Needle Gauge: 21     Additional Needles:   Narrative:  Start time: 12/07/2018 12:25 PM End time: 12/07/2018 12:29 PM Injection made incrementally with aspirations every 5 mL.  Performed by: Personally  Anesthesiologist: Audry Pili, MD  Additional Notes: No pain on injection. No increased resistance to injection. Injection made in 5cc increments. Good needle visualization. Patient tolerated the procedure well.

## 2018-12-07 NOTE — Op Note (Signed)
Operative note December 07, 2018 Roseanne Kaufman MD  Preoperative diagnosis right thumb carpometacarpal arthritis with collapse escape and failure of conservative management  Postop diagnosis: The same  Operative procedure #1 right thumb CMC arthroplasty (removal of the trapezium at the basilar thumb joint) right wrist #2 abductor pollicis longus digastric portion tendon transfer to the FCR then the APL proper and back upon themselves with multiple figure-of-eight throws (Weilby tendon transfer).  #3 APL one third proper portion tendon transfer to the first metacarpal FCR back upon the first metacarpal with Bio-Tenodesis screw fixation (Arthrex).  #4 abductor pollicis longus tenodesis (shortening of her wrist extensors wrist forearm level to prevent dorsal lateral escape) right wrist.  Surgeon Roseanne Kaufman.  Assistant none.  Anesthesia block with IV sedation.  Estimated blood loss minimal.  Tourniquet time less than an hour.  Operative procedure in detail: Patient was seen by myself and anesthesia taken to the operative theater and underwent smooth induction of IV sedation.  Preoperative block was in excellent working fashion.  I performed to Hibiclens scrubs followed by a Betadine surgical scrub and paint.  Timeout was observed and preoperative antibiotics were given in form of Ancef without difficulty.  Timeout was observed and following this we then performed a dorsal incision about the thumb.  The dorsal incision was created the interval between the EPB and APL was created.  The princeps pollicis arterial structures were identified and dissected following this freer elevator was placed on either side of the trapezium and it was then excised piecemeal followed by tenolysis of the flexor carpi radialis tendon.  Following this a drill hole was placed dorsal to palmar exiting intra-articularly in line with the palmar beak ligament.  The patient tolerated this well this was enlarged to a 3 oh  drill bit without difficulty.  Once this was complete we then performed a very careful and cautious irrigation approach to the wound and this completed the arthroplasty/trapezia ectomy portion of the procedure.  Once this was complete we then performed a counterincision and harvested the APL digastric portion and a one third proper portion of the APL.  5 ellipse of the 2 over they were placed around these tendons.  I then performed 2 separate tendon transfers.  The patient underwent tendon transfer of the APL proper portion to the first metacarpal the tendon was placed through the drill hole dorsal to palmar extent around the FCR and through the FCR and then looped around the FCR 2 times and then followed by a pathway back into the metacarpal.  This was secured with FiberWire suture at the 3 over IV and a Bio-Tenodesis screw from Arthrex.  This completed the [employer] tendon transfer.  Following this the digastric portion of the APL was placed around the FCR and then back up on the APL proper in multiple figure-of-eight throws this was secured with 3-0 FiberWire and completed the Weilby tendon transfer.  The patient tolerated this well.  This was the second tendon transfer.  Following this we then performed an APL tenodesis this was a shortening of her wrist extensor at the wrist forearm level to prevent dorsal lateral escape with 3-0 FiberWire.  The patient tolerated this well.  Following this the thumb sat wonderfully tension without complications.  I closed the incision for tendon harvest with 4-0 Prolene and closed my incision with the tourniquet deflated with 4-0 Prolene.  Prior to closure we did perform a capsular closure over the [facility] joint and verified the princeps pollicis  artery looked to be in excellent position and competency.  Patient tolerated this well.  She had soft compartments no complicating features and a thumb spica splint was applied.  Should be admitted for IV antibiotics general  postop observation and pain management.  We will rehab her according to our standard protocol.  It has been a pleasure seeing  her today  Dominica Severin MD

## 2018-12-08 DIAGNOSIS — M189 Osteoarthritis of first carpometacarpal joint, unspecified: Secondary | ICD-10-CM | POA: Diagnosis not present

## 2018-12-08 NOTE — Discharge Instructions (Signed)

## 2018-12-08 NOTE — Progress Notes (Signed)
Discharge paperwork and instructions given to pt. Pt not in distress and tolerated well. 

## 2018-12-08 NOTE — Progress Notes (Signed)
Patient ID: [name], female   DOB: May 28, 1965, 53 y.o.   MRN: [id number] Patient looks great.  We will discharge her today.  I have gone over all instructions.  She is neurovascular intact awake alert and oriented.  The patient is alert and oriented in no acute distress. The patient complains of pain in the affected upper extremity.  The patient is noted to have a normal HEENT exam. Lung fields show equal chest expansion and no shortness of breath. Abdomen exam is nontender without distention. Lower extremity examination does not show any fracture dislocation or blood clot symptoms. Pelvis is stable and the neck and back are stable and nontender.  Status post [facility] arthroplasty right upper extremity final diagnosis Please see discharge summary Imaan Padgett MD

## 2018-12-08 NOTE — Discharge Summary (Signed)
Physician Discharge Summary  Patient ID: [name] MRN: [id number] DOB/AGE: May 03, 1965 53 y.o.  Admit date: 12/07/2018 Discharge date:   Admission Diagnoses: Rt first carpometacarpal joint osteoarthritis Past Medical History:  Diagnosis Date  . 3-vessel CAD    cardiologist -  dr [name] [facility] clinic)  . Bipolar 1 disorder (HCC)   . Chronic chest pain    controlled w/ meds and prn nitro  . Coronary artery disease   . Family history of premature CAD   . GERD (gastroesophageal reflux disease)   . History of MI (myocardial infarction)   . Hyperlipidemia   . Hypertension   . Myocardial infarction (HCC)   . 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 S/P drug eluting coronary stent placement    X2   in 2005  . Sleep apnea    USES CPAP, PT DOES NOT KNOW SETTINGS  . SUI (stress urinary incontinence, female)     Discharge Diagnoses:  Active Problems:   Arthritis of carpometacarpal [facility]) joint of right thumb   Surgeries: Procedure(s): Right thumb carpometacarpal arthroplasty with double tendon transfer and repair reconstruction as necessary on 12/07/2018    Consultants:   Discharged Condition: Improved  Hospital Course: [name] is an 53 y.o. female who was admitted 12/07/2018 with a chief complaint of No chief complaint on file. , and found to have a diagnosis of Rt first carpometacarpal joint osteoarthritis.  They were brought to the operating room on 12/07/2018 and underwent Procedure(s): Right thumb carpometacarpal arthroplasty with double tendon transfer and repair reconstruction as necessary.    They were given perioperative antibiotics:  Anti-infectives (From admission, onward)   Start     Dose/Rate Route Frequency Ordered Stop   12/08/18 0100  ceFAZolin (ANCEF) IVPB 1 g/50 mL premix     1 g 100 mL/hr over 30 Minutes Intravenous Every 8 hours 12/07/18 1554     12/07/18 1700  ceFAZolin (ANCEF) IVPB 1 g/50 mL premix     1 g 100 mL/hr over 30 Minutes  Intravenous NOW 12/07/18 1554 12/07/18 1803   12/07/18 1047  ceFAZolin (ANCEF) 2-4 GM/100ML-% IVPB    Note to Pharmacy: [name]   : cabinet override      12/07/18 1047 12/07/18 1318   12/07/18 1045  ceFAZolin (ANCEF) IVPB 2g/100 mL premix     2 g 200 mL/hr over 30 Minutes Intravenous On call to O.R. 12/07/18 1039 12/07/18 1348    .  They were given sequential compression devices, early ambulation, and Other (comment) for DVT prophylaxis.  Recent vital signs:  Patient Vitals for the past 24 hrs:  BP Temp Temp src Pulse Resp SpO2  12/08/18 0729 130/74 99.3 F (37.4 C) Oral 67 15 94 %  12/08/18 0346 126/73 98.4 F (36.9 C) Oral 61 16 97 %  12/07/18 2317 109/67 98.6 F (37 C) Oral 63 15 95 %  12/07/18 1924 107/73 97.8 F (36.6 C) Oral 67 17 95 %  12/07/18 1535 - (!) 97 F (36.1 C) - 66 20 97 %  12/07/18 1529 - - - 69 (!) 21 98 %  12/07/18 1528 - - - 68 18 98 %  12/07/18 1525 - - - 67 20 97 %  12/07/18 1521 118/82 - - - - -  12/07/18 1515 - - - 65 17 97 %  12/07/18 1506 116/80 - - - - -  12/07/18 1459 - - - 72 16 97 %  12/07/18 1451 104/71 Marland Kitchen)  96.8 F (36 C) - 73 20 97 %  12/07/18 1240 108/60 - - 64 15 99 %  12/07/18 1235 110/64 - - 61 16 99 %  12/07/18 1230 118/71 - - (!) 59 16 100 %  .  Recent laboratory studies: No results found.  Discharge Medications:   Allergies as of 12/08/2018   No Known Allergies     Medication List    STOP taking these medications   amoxicillin 875 MG tablet Commonly known as: AMOXIL   benzonatate 100 MG capsule Commonly known as: TESSALON     TAKE these medications   albuterol 108 (90 Base) MCG/ACT inhaler Commonly known as: VENTOLIN HFA Inhale 2 puffs into the lungs every 4 (four) hours as needed for wheezing or shortness of breath. What changed: Another medication with the same name was removed. Continue taking this medication, and follow the directions you see here.   aspirin EC 325 MG tablet Take 325 mg by mouth  daily.   atorvastatin 80 MG tablet Commonly known as: LIPITOR Take 80 mg by mouth daily.   estradiol 1 MG tablet Commonly known as: ESTRACE Take 1 mg by mouth daily.   isosorbide mononitrate 30 MG 24 hr tablet Commonly known as: IMDUR Take 1 tablet (30 mg total) by mouth daily.   lisinopril 10 MG tablet Commonly known as: ZESTRIL Take 1 tablet (10 mg total) by mouth daily.   metoprolol succinate 25 MG 24 hr tablet Commonly known as: TOPROL-XL Take 25 mg by mouth daily.   multivitamin with minerals Tabs tablet Take 1 tablet by mouth daily.   nitroGLYCERIN 0.4 MG SL tablet Commonly known as: NITROSTAT Place 0.4 mg under the tongue every 5 (five) minutes as needed for chest pain.   omeprazole 20 MG tablet Commonly known as: PRILOSEC OTC Take 20 mg by mouth daily.   ranolazine 1000 MG SR tablet Commonly known as: RANEXA Take 1,000 mg by mouth daily.       Diagnostic Studies: No results found.  They benefited maximally from their hospital stay and there were no complications.     Disposition: Discharge disposition: 01-Home or Self Care      Discharge Instructions    Call MD / Call 911   Complete by: As directed    If you experience chest pain or shortness of breath, CALL 911 and be transported to the hospital emergency room.  If you develope a fever above 101 F, pus (white drainage) or increased drainage or redness at the wound, or calf pain, call your surgeon's office.   Constipation Prevention   Complete by: As directed    Drink plenty of fluids.  Prune juice may be helpful.  You may use a stool softener, such as Colace (over the counter) 100 mg twice a day.  Use MiraLax (over the counter) for constipation as needed.   Diet - low sodium heart healthy   Complete by: As directed    Increase activity slowly as tolerated   Complete by: As directed      Follow-up Information    Diana Severin, MD Follow up in 14 day(s).   Specialty: Orthopedic Surgery Why:  We will call for your follow-up visit to be seen in 12 to 14 days Contact information: [address] [place] [address] [phone]          Status post right thumb [facility] replacement arthroplasty.  Patient did well with her overnight stay.  She has her medicines called in  through my office.  We will see her in 12 to 14 days.  At the time of discharge she was awake alert and oriented and neurovascularly intact without complications.  She looks quite well and have gone over all postop care plans and instructions.  It is been a pleasure to treat her.  There were no complicating features during her stay  Signed: Willa Frater III 12/08/2018, 12:03 PM

## 2018-12-08 NOTE — Plan of Care (Signed)

## 2018-12-08 NOTE — Plan of Care (Signed)
  Problem: Pain Managment: Goal: General experience of comfort will improve Outcome: Progressing   

## 2018-12-12 ENCOUNTER — Encounter (HOSPITAL_COMMUNITY): Payer: Self-pay | Admitting: Orthopedic Surgery

## 2018-12-14 ENCOUNTER — Encounter (HOSPITAL_COMMUNITY): Payer: Self-pay | Admitting: Orthopedic Surgery

## 2019-03-03 IMAGING — CR DG CHEST 2V
2 series · 2 of 2 positions shown · non-contrast
Comparison: 06/13/2014 chest radiograph.

CLINICAL DATA: Chest pain

EXAM:
CHEST - 2 VIEW

[chest pa]
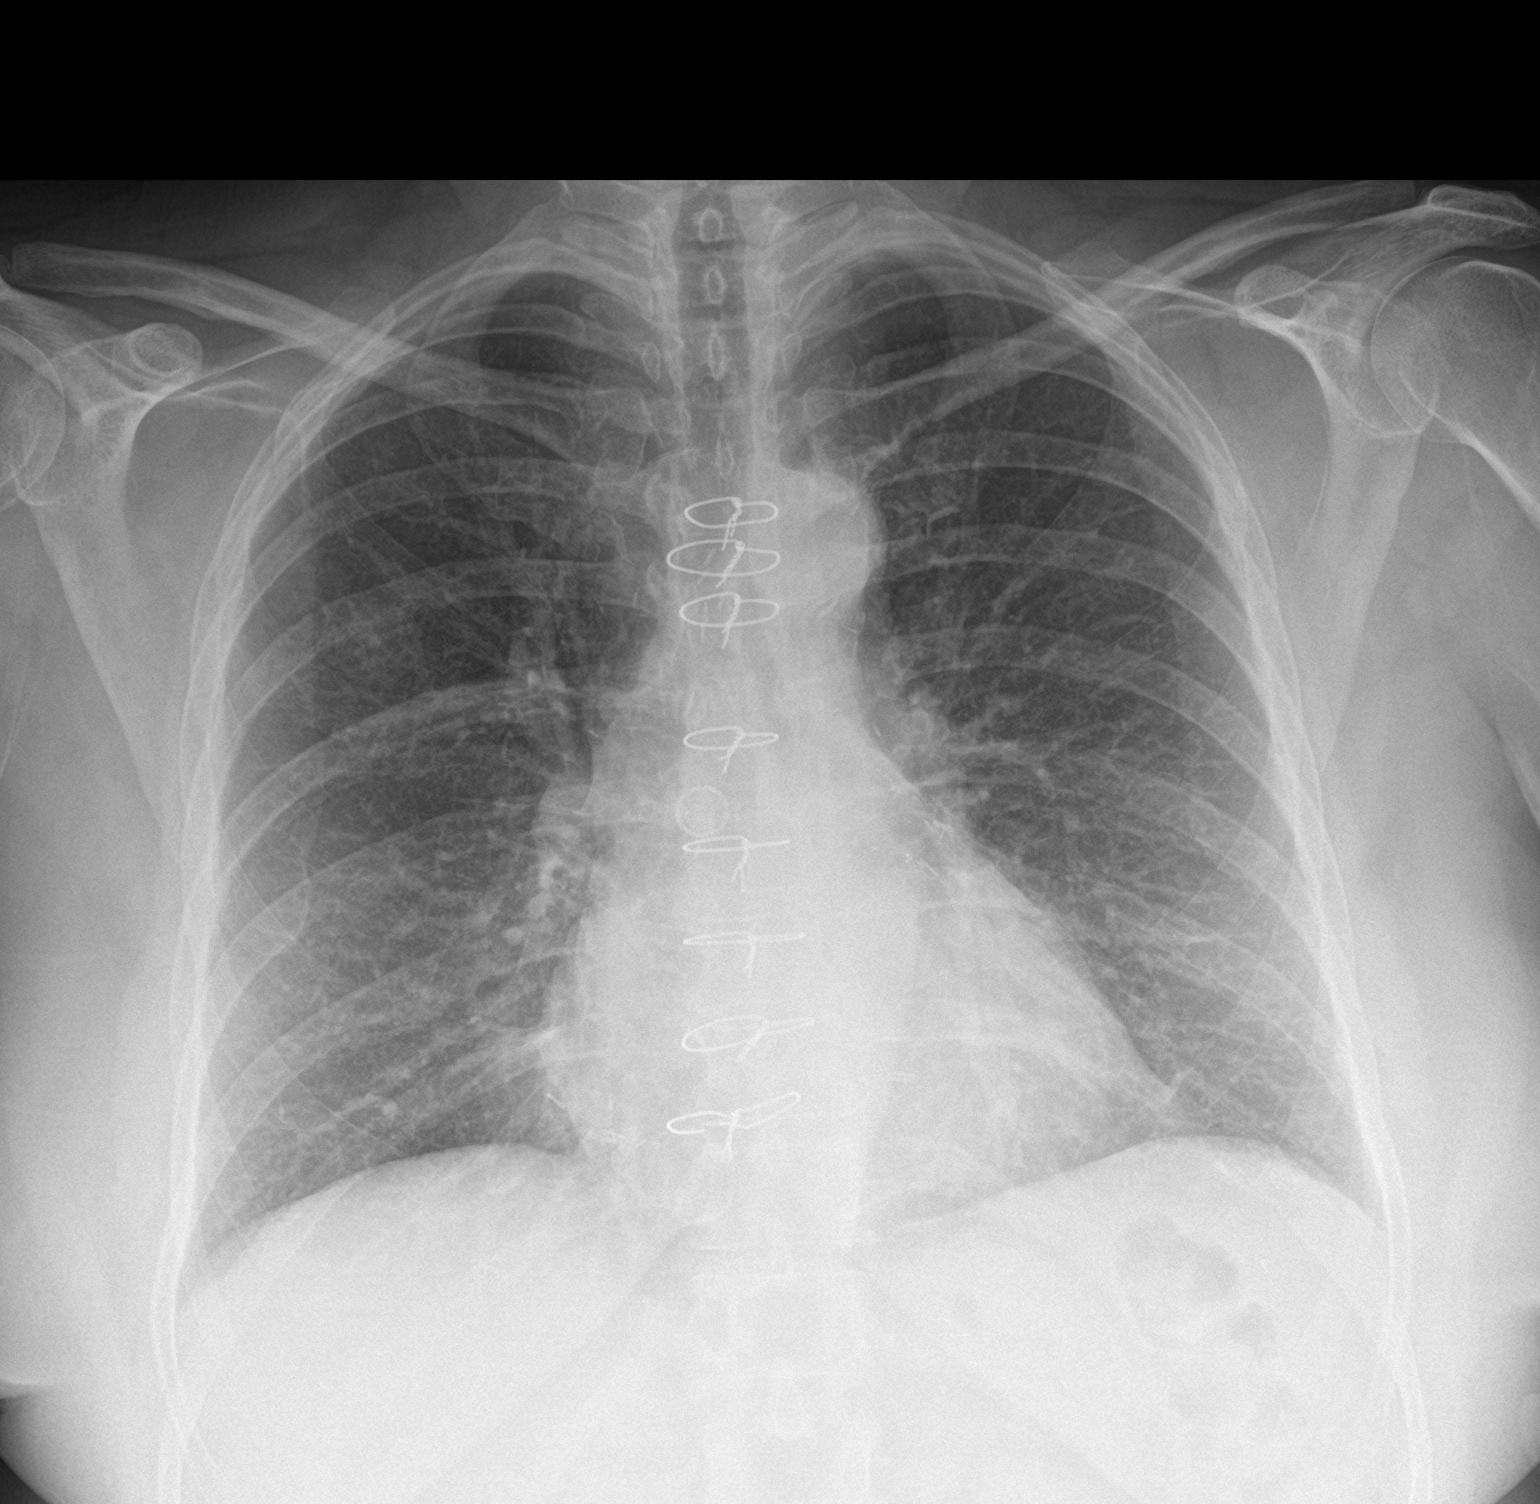

[chest lat]
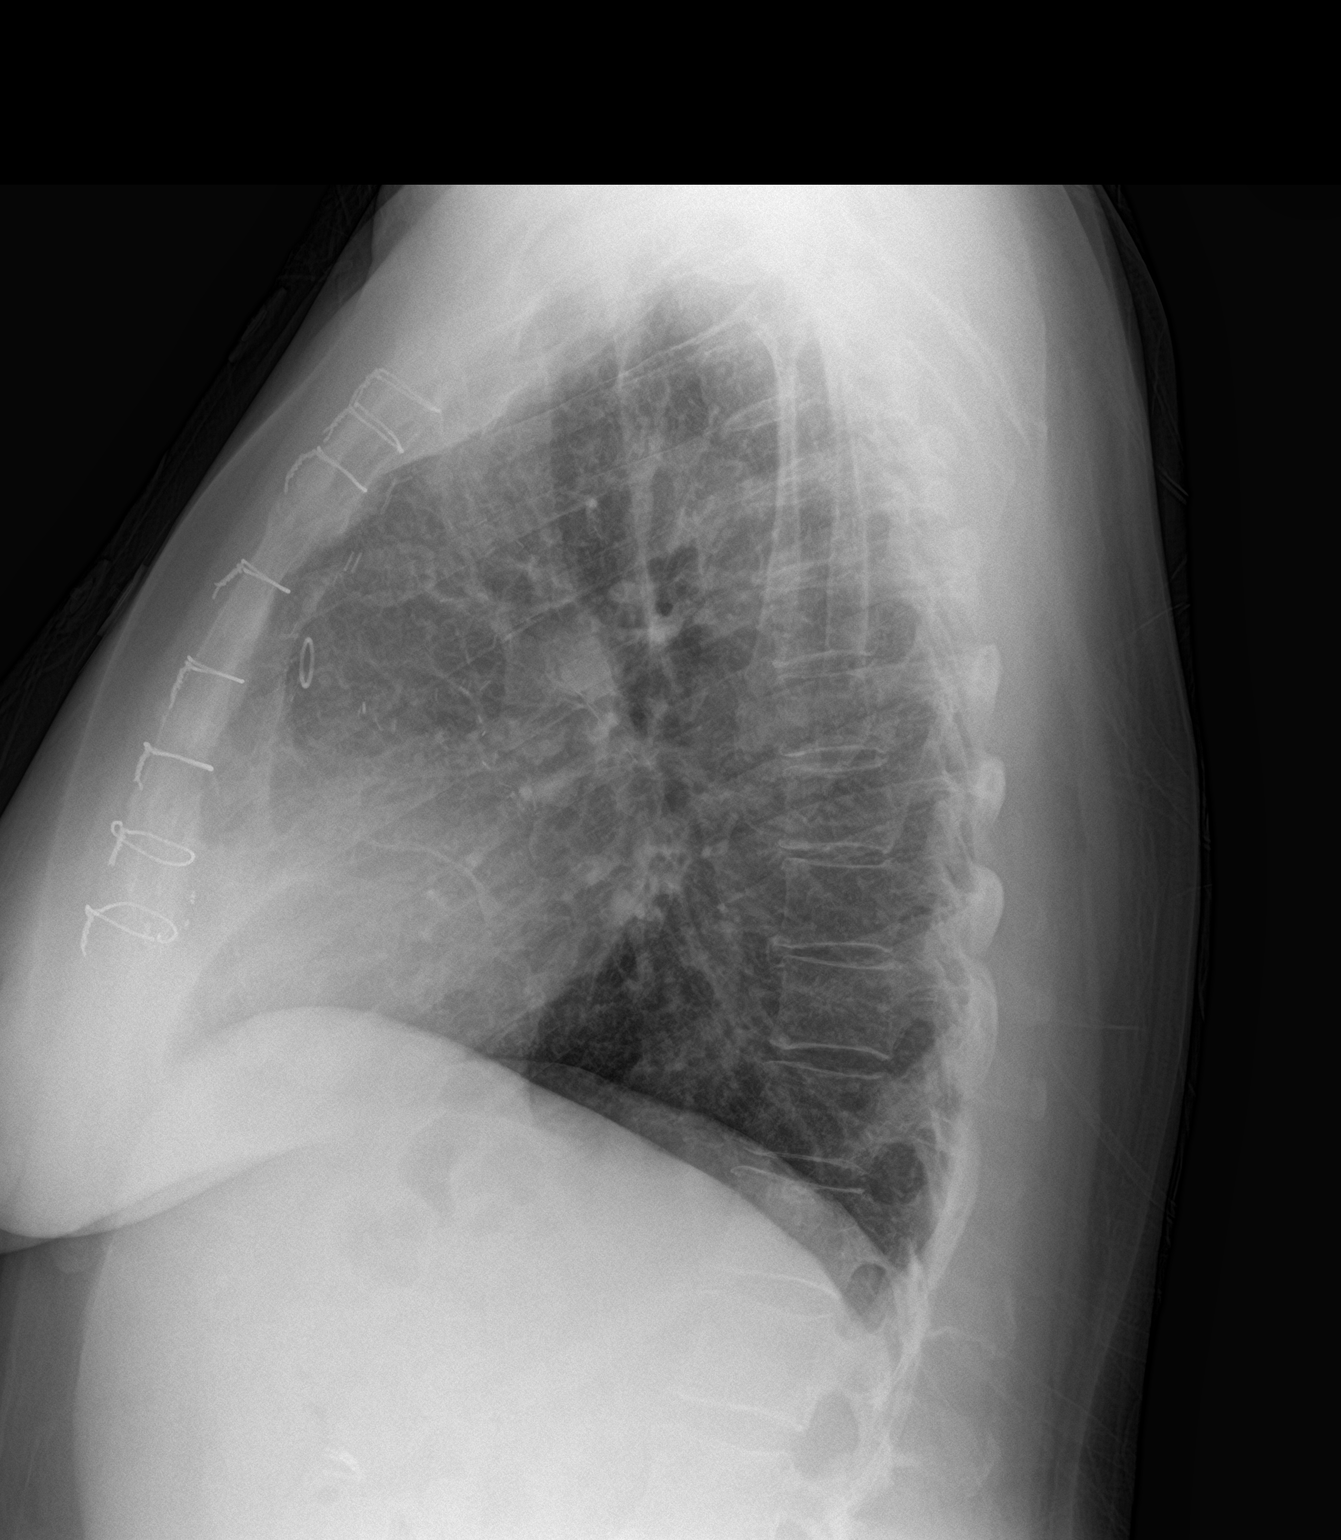

[2 of 2 positions shown; findings below may reference images not displayed]

FINDINGS: Stable configuration sternotomy wires with discontinuity in the
lower most sternotomy wire. CABG clips overlie the mediastinum.
Stable cardiomediastinal silhouette with normal heart size. No
pneumothorax. No pleural effusion. Lungs appear clear, with no acute
consolidative airspace disease and no pulmonary edema.
Cholecystectomy clips are seen in the right upper quadrant of the
abdomen.
IMPRESSION: No active cardiopulmonary disease.

## 2019-03-14 DIAGNOSIS — M189 Osteoarthritis of first carpometacarpal joint, unspecified: Secondary | ICD-10-CM | POA: Diagnosis not present

## 2019-03-14 DIAGNOSIS — Z4789 Encounter for other orthopedic aftercare: Secondary | ICD-10-CM | POA: Diagnosis not present

## 2019-03-14 DIAGNOSIS — M79644 Pain in right finger(s): Secondary | ICD-10-CM | POA: Diagnosis not present

## 2019-03-14 DIAGNOSIS — M1811 Unilateral primary osteoarthritis of first carpometacarpal joint, right hand: Secondary | ICD-10-CM | POA: Diagnosis not present

## 2019-04-11 DIAGNOSIS — M189 Osteoarthritis of first carpometacarpal joint, unspecified: Secondary | ICD-10-CM | POA: Diagnosis not present

## 2019-04-11 DIAGNOSIS — M1811 Unilateral primary osteoarthritis of first carpometacarpal joint, right hand: Secondary | ICD-10-CM | POA: Diagnosis not present

## 2019-06-06 DIAGNOSIS — Z951 Presence of aortocoronary bypass graft: Secondary | ICD-10-CM | POA: Diagnosis not present

## 2019-06-06 DIAGNOSIS — I10 Essential (primary) hypertension: Secondary | ICD-10-CM | POA: Diagnosis not present

## 2019-06-06 DIAGNOSIS — I25700 Atherosclerosis of coronary artery bypass graft(s), unspecified, with unstable angina pectoris: Secondary | ICD-10-CM | POA: Diagnosis not present

## 2019-06-06 DIAGNOSIS — I2510 Atherosclerotic heart disease of native coronary artery without angina pectoris: Secondary | ICD-10-CM | POA: Diagnosis not present

## 2020-04-13 DIAGNOSIS — M13841 Other specified arthritis, right hand: Secondary | ICD-10-CM | POA: Diagnosis not present

## 2020-04-13 DIAGNOSIS — M65312 Trigger thumb, left thumb: Secondary | ICD-10-CM | POA: Diagnosis not present

## 2020-05-04 DIAGNOSIS — M65312 Trigger thumb, left thumb: Secondary | ICD-10-CM | POA: Diagnosis not present

## 2020-05-04 DIAGNOSIS — Z4789 Encounter for other orthopedic aftercare: Secondary | ICD-10-CM | POA: Diagnosis not present

## 2020-05-18 DIAGNOSIS — M25642 Stiffness of left hand, not elsewhere classified: Secondary | ICD-10-CM | POA: Diagnosis not present

## 2020-07-21 DIAGNOSIS — Z9889 Other specified postprocedural states: Secondary | ICD-10-CM | POA: Diagnosis not present

## 2020-07-21 DIAGNOSIS — I10 Essential (primary) hypertension: Secondary | ICD-10-CM | POA: Diagnosis not present

## 2020-07-21 DIAGNOSIS — I25700 Atherosclerosis of coronary artery bypass graft(s), unspecified, with unstable angina pectoris: Secondary | ICD-10-CM | POA: Diagnosis not present

## 2020-07-21 DIAGNOSIS — Z951 Presence of aortocoronary bypass graft: Secondary | ICD-10-CM | POA: Diagnosis not present

## 2020-10-08 DIAGNOSIS — Z20822 Contact with and (suspected) exposure to covid-19: Secondary | ICD-10-CM | POA: Diagnosis not present

## 2021-02-17 DIAGNOSIS — R5383 Other fatigue: Secondary | ICD-10-CM | POA: Diagnosis not present

## 2021-02-17 DIAGNOSIS — Z8709 Personal history of other diseases of the respiratory system: Secondary | ICD-10-CM | POA: Diagnosis not present

## 2021-02-17 DIAGNOSIS — R059 Cough, unspecified: Secondary | ICD-10-CM | POA: Diagnosis not present

## 2021-02-17 DIAGNOSIS — R0602 Shortness of breath: Secondary | ICD-10-CM | POA: Diagnosis not present

## 2021-02-22 ENCOUNTER — Emergency Department (HOSPITAL_BASED_OUTPATIENT_CLINIC_OR_DEPARTMENT_OTHER)
Admission: EM | Admit: 2021-02-22 | Discharge: 2021-02-22 | Disposition: A | Discharge status: Home / Self Care | Payer: 59 | Attending: Emergency Medicine | Admitting: Emergency Medicine

## 2021-02-22 ENCOUNTER — Encounter (HOSPITAL_BASED_OUTPATIENT_CLINIC_OR_DEPARTMENT_OTHER): Payer: Self-pay | Admitting: Urology

## 2021-02-22 ENCOUNTER — Other Ambulatory Visit: Payer: Self-pay

## 2021-02-22 ENCOUNTER — Emergency Department (HOSPITAL_BASED_OUTPATIENT_CLINIC_OR_DEPARTMENT_OTHER): Payer: 59

## 2021-02-22 DIAGNOSIS — I10 Essential (primary) hypertension: Secondary | ICD-10-CM | POA: Diagnosis not present

## 2021-02-22 DIAGNOSIS — Z20822 Contact with and (suspected) exposure to covid-19: Secondary | ICD-10-CM | POA: Insufficient documentation

## 2021-02-22 DIAGNOSIS — R051 Acute cough: Secondary | ICD-10-CM | POA: Diagnosis not present

## 2021-02-22 DIAGNOSIS — I2510 Atherosclerotic heart disease of native coronary artery without angina pectoris: Secondary | ICD-10-CM | POA: Diagnosis not present

## 2021-02-22 DIAGNOSIS — Z7982 Long term (current) use of aspirin: Secondary | ICD-10-CM | POA: Diagnosis not present

## 2021-02-22 DIAGNOSIS — R0981 Nasal congestion: Secondary | ICD-10-CM | POA: Insufficient documentation

## 2021-02-22 DIAGNOSIS — Z951 Presence of aortocoronary bypass graft: Secondary | ICD-10-CM | POA: Insufficient documentation

## 2021-02-22 DIAGNOSIS — J40 Bronchitis, not specified as acute or chronic: Secondary | ICD-10-CM | POA: Diagnosis not present

## 2021-02-22 DIAGNOSIS — J449 Chronic obstructive pulmonary disease, unspecified: Secondary | ICD-10-CM | POA: Insufficient documentation

## 2021-02-22 DIAGNOSIS — R69 Illness, unspecified: Secondary | ICD-10-CM | POA: Diagnosis not present

## 2021-02-22 DIAGNOSIS — F17210 Nicotine dependence, cigarettes, uncomplicated: Secondary | ICD-10-CM | POA: Insufficient documentation

## 2021-02-22 DIAGNOSIS — Z79899 Other long term (current) drug therapy: Secondary | ICD-10-CM | POA: Insufficient documentation

## 2021-02-22 DIAGNOSIS — J209 Acute bronchitis, unspecified: Secondary | ICD-10-CM | POA: Insufficient documentation

## 2021-02-22 DIAGNOSIS — R059 Cough, unspecified: Secondary | ICD-10-CM | POA: Diagnosis not present

## 2021-02-22 LAB — RESP PANEL BY RT-PCR (FLU A&B, COVID) ARPGX2
Influenza A by PCR: NEGATIVE
Influenza B by PCR: NEGATIVE
SARS Coronavirus 2 by RT PCR: NEGATIVE

## 2021-02-22 MED ORDER — ALBUTEROL SULFATE HFA 108 (90 BASE) MCG/ACT IN AERS: 2.0000 | INHALATION_SPRAY | RESPIRATORY_TRACT | 2 refills | Status: AC | PRN

## 2021-02-22 MED ORDER — BENZONATATE 100 MG PO CAPS: 100.0000 mg | ORAL_CAPSULE | Freq: Three times a day (TID) | ORAL | 0 refills | Status: DC | PRN

## 2021-02-22 MED ORDER — PREDNISONE 10 MG PO TABS: 40.0000 mg | ORAL_TABLET | Freq: Every day | ORAL | 0 refills | Status: AC

## 2021-02-22 MED ORDER — ALBUTEROL SULFATE HFA 108 (90 BASE) MCG/ACT IN AERS: 2.0000 | INHALATION_SPRAY | RESPIRATORY_TRACT | 2 refills | Status: DC | PRN

## 2021-02-22 MED ORDER — DOXYCYCLINE HYCLATE 100 MG PO CAPS: 100.0000 mg | ORAL_CAPSULE | Freq: Two times a day (BID) | ORAL | 0 refills | Status: AC

## 2021-02-22 MED ORDER — BENZONATATE 100 MG PO CAPS: 100.0000 mg | ORAL_CAPSULE | Freq: Three times a day (TID) | ORAL | 0 refills | Status: AC | PRN

## 2021-02-22 MED ORDER — PREDNISONE 10 MG PO TABS: 40.0000 mg | ORAL_TABLET | Freq: Every day | ORAL | 0 refills | Status: DC

## 2021-02-22 MED ORDER — DOXYCYCLINE HYCLATE 100 MG PO CAPS: 100.0000 mg | ORAL_CAPSULE | Freq: Two times a day (BID) | ORAL | 0 refills | Status: DC

## 2021-02-22 NOTE — ED Notes (Signed)
Was seen at [facility] last Tuesday cause she had cough, given meds inhalers and steriods  not feeling any better today ,  did not have chest xray and that she what she wanted

## 2021-02-22 NOTE — ED Triage Notes (Signed)
Productive Cough, hoarse voice x 10 days,  Seen at [facility] Friday, taking steroids and inhalers with no relief NAD now

## 2021-02-22 NOTE — ED Provider Notes (Signed)
[facility] EMERGENCY DEPARTMENT Provider Note   CSN: [id number] Arrival date & time: 02/22/21  1311     History Chief Complaint  Patient presents with   Cough    [name] is a 55 y.o. female.   Cough Associated symptoms: shortness of breath   Associated symptoms: no chest pain, no chills, no fever and no sore throat   Patient with history of CAD, HLD, HTN, OSA and GERD, and COPD, presenting for persistent productive cough and shortness of breath.  Symptoms are following a recent flulike illness.  She has a 46-year smoking history.  She does continue to smoke cigarettes.  She has been utilizing nebulized breathing treatments at home for years.  She is not on a daily steroid inhaler.  She was seen in urgent care on 5 days ago and prescribed prednisone, 10 mg/day, albuterol as needed, and Phenergan DM.  This has not relieved her symptoms.  She presents today due to persistent symptoms and concern of pneumonia.  She has had productive cough and describes mucus as clear-white.    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-10-2002   S/P drug eluting coronary stent placement    X2   in 2005   Sleep apnea    USES CPAP, PT DOES NOT KNOW SETTINGS   SUI (stress urinary incontinence, female)     Patient Active Problem List   Diagnosis Date Noted   Arthritis of carpometacarpal [facility]) joint of right thumb 12/07/2018   Bipolar disorder (HCC) 05/06/2017   Obesity 05/06/2017   Sleep apnea 05/06/2017   Hyperlipidemia 05/06/2017   NSTEMI (non-ST elevated myocardial infarction) (HCC) 05/06/2017    Asthma 06/30/2015   Essential (primary) hypertension 12/05/2013   GERD (gastroesophageal reflux disease) 12/05/2013   History of cardiac cath 12/05/2013   S/P CABG x 3 12/05/2013   ASCVD (arteriosclerotic cardiovascular disease) 08/24/2013   Cardiovascular disease 08/24/2013    Past Surgical History:  Procedure Laterality Date   CARDIAC CATHETERIZATION  08-31-2006  dr hochrein   Severe native three-vessel CAD/  Occluded vein graft to the  RCA,  LM normal,  RIMA to OM and LIMA to LAD widely patent/  slightly reduced ef with regional wall motion abnormality,  ef 55% with mild inferior hypokinesis/  continued aggressive medical management   CARDIAC CATHETERIZATION  11-19-2009  dr Darrold Junker [facility]   Patent LIMA to LAD, patent RIMA to OM1 with occluded native vessel, occluded RCA with occluded SVG to PDA. Collaterals from LAD to PDA and OM1 present/  diaphragmatic akinesis,  ef 55%   CARDIOVASCULAR STRESS TEST  07-01-2014  dr Lyn Hollingshead [facility]   lexus scan sestambi study--  LVEF 36%,  mild inferior scar, mild to moderate lateral wall scar without ischemia   CARPOMETACARPAL ([facility]) FUSION OF THUMB Right 12/07/2018   Procedure: Right thumb carpometacarpal arthroplasty with double tendon transfer and repair reconstruction as necessary;  Surgeon: Dominica Severin, MD;  Location: [facility] OR;  Service: Orthopedics;  Laterality: Right;   CORONARY ANGIOPLASTY WITH STENT PLACEMENT  oct 2005   [facility]   post CABG--  DES x2 to SVG to RCA for PDA and Posterolateral occulsion   CORONARY ARTERY BYPASS GRAFT  09-10-2002   dr Marilu Favre [facility]   x4--  LIMA to LAD,  RIMA to OM2, SVG to PDA and POSTEROLATERAL   CYSTO N/A 08/26/2014   Procedure: CYSTO;  Surgeon: Alfredo Martinez, MD;  Location: [facility];  Service: Urology;  Laterality: N/A;   CYSTOSCOPY N/A 10/13/2015   Procedure: CYSTOSCOPY;  Surgeon: Alfredo Martinez, MD;  Location: [facility] ORS;  Service: Urology;  Laterality: N/A;   CYSTOURETHROPEXY/   SPARC  SLING  06-25-2009   EXCISION LEFT ARM MASS  02-05-2007   LAPAROSCOPIC CHOLECYSTECTOMY  04-04-2002   LEFT HEART CATH AND CORS/GRAFTS ANGIOGRAPHY N/A 05/08/2017   Procedure: LEFT HEART CATH AND CORS/GRAFTS ANGIOGRAPHY;  Surgeon: Runell Gess, MD;  Location: [facility] INVASIVE CV LAB;  Service: Cardiovascular;  Laterality: N/A;   PUBOVAGINAL SLING  10/13/2015   Procedure: excision and closure of vaginal sinus;  Surgeon: Alfredo Martinez, MD;  Location: [facility] ORS;  Service: Urology;;   REMOVAL OF URINARY SLING N/A 08/26/2014   Procedure: REMOVAL OF URINARY INFECTED SLING.;  Surgeon: Alfredo Martinez, MD;  Location: [facility];  Service: Urology;  Laterality: N/A;   RESECTION VULVAR CONDYLOMATA AND LASER ABLATION VULVA AND VAGINAL CONDYLOMATA  05-06-2010   TRANSTHORACIC ECHOCARDIOGRAM  07-01-2014   LVEF 50%,  mild MR and TR   VAGINAL HYSTERECTOMY  10-07-2003   COMPLETE     OB History   No obstetric history on file.     Family History  Problem Relation Age of Onset   Heart disease Mother    Hyperlipidemia Mother    Hypertension Mother    Heart disease Maternal Grandmother    Cancer Brother     Social History   Tobacco Use   Smoking status: Every Day    Packs/day: 1.00    Years: 37.00    Pack years: 37.00    Types: Cigarettes   Smokeless tobacco: Never   Tobacco comments:    1/2 TO 3/4 PPD  Vaping Use   Vaping Use: Never used  Substance Use Topics   Alcohol use: Yes    Comment: RARE   Drug use: No    Home Medications Prior to Admission medications   Medication Sig Start Date End Date Taking? Authorizing Provider  benzonatate (TESSALON) 100 MG capsule Take 1 capsule (100 mg total) by mouth 3 (three) times daily as needed for cough. 02/22/21  Yes Gloris Manchester, MD  doxycycline (VIBRAMYCIN) 100 MG capsule Take 1 capsule (100 mg total) by mouth 2 (two) times daily for 5 days. 02/22/21 02/27/21 Yes Gloris Manchester, MD  predniSONE (DELTASONE) 10 MG tablet Take 4  tablets (40 mg total) by mouth daily for 5 days. 02/22/21 02/27/21 Yes Gloris Manchester, MD  albuterol (VENTOLIN HFA) 108 (90 Base) MCG/ACT inhaler Inhale 2 puffs into the lungs every 4 (four) hours as needed for wheezing or shortness of breath. 02/22/21   Gloris Manchester, MD  aspirin EC 325 MG tablet Take 325 mg by mouth daily.    [provider]  atorvastatin (LIPITOR) 80 MG tablet Take 80 mg by mouth daily.  03/12/18   [provider]  estradiol (ESTRACE) 1 MG tablet Take 1 mg by mouth daily.    [provider]  isosorbide mononitrate (IMDUR) 30 MG 24 hr tablet Take 1 tablet (30 mg  total) by mouth daily. 05/11/17   Lanelle Bal, MD  lisinopril (PRINIVIL,ZESTRIL) 10 MG tablet Take 1 tablet (10 mg total) by mouth daily. 05/11/17   Lanelle Bal, MD  metoprolol succinate (TOPROL-XL) 25 MG 24 hr tablet Take 25 mg by mouth daily.    [provider]  Multiple Vitamin (MULTIVITAMIN WITH MINERALS) TABS tablet Take 1 tablet by mouth daily.    [provider]  nitroGLYCERIN (NITROSTAT) 0.4 MG SL tablet Place 0.4 mg under the tongue every 5 (five) minutes as needed for chest pain.    [provider]  omeprazole (PRILOSEC OTC) 20 MG tablet Take 20 mg by mouth daily.    [provider]  ranolazine (RANEXA) 1000 MG SR tablet Take 1,000 mg by mouth daily.     [provider]    Allergies    Patient has no known allergies.  Review of Systems   Review of Systems  Constitutional:  Positive for fatigue. Negative for chills and fever.  HENT:  Positive for congestion and voice change. Negative for sore throat and trouble swallowing.   Respiratory:  Positive for cough and shortness of breath.   Cardiovascular:  Negative for chest pain and palpitations.  Gastrointestinal:  Negative for abdominal pain, nausea and vomiting.  All other systems reviewed and are negative.  Physical Exam Updated Vital Signs BP (!) 146/100    Pulse 64    Temp  98.2 F (36.8 C) (Oral)    Resp 18    Ht 5\' 5"  (1.651 m)    Wt 81.6 kg    SpO2 99%    BMI 29.95 kg/m   Physical Exam Vitals and nursing note reviewed.  Constitutional:      General: She is not in acute distress.    Appearance: Normal appearance. She is well-developed. She is not ill-appearing, toxic-appearing or diaphoretic.  HENT:     Head: Normocephalic and atraumatic.     Right Ear: External ear normal.     Left Ear: External ear normal.     Nose: Congestion present.  Eyes:     General: No scleral icterus.    Extraocular Movements: Extraocular movements intact.     Conjunctiva/sclera: Conjunctivae normal.  Cardiovascular:     Rate and Rhythm: Normal rate and regular rhythm.     Heart sounds: No murmur heard. Pulmonary:     Effort: Pulmonary effort is normal. No respiratory distress.     Breath sounds: Normal breath sounds. No wheezing or rhonchi.  Chest:     Chest wall: No tenderness.  Abdominal:     General: Abdomen is flat.     Palpations: Abdomen is soft.     Tenderness: There is no abdominal tenderness.  Musculoskeletal:        General: No swelling.     Cervical back: Normal range of motion and neck supple.     Right lower leg: No edema.     Left lower leg: No edema.  Skin:    General: Skin is warm and dry.     Coloration: Skin is not jaundiced or pale.  Neurological:     General: No focal deficit present.     Mental Status: She is alert and oriented to person, place, and time.  Psychiatric:        Mood and Affect: Mood normal.        Behavior: Behavior normal.        Thought Content: Thought content normal.  Judgment: Judgment normal.    ED Results / Procedures / Treatments   Labs (all labs ordered are listed, but only abnormal results are displayed) Labs Reviewed  RESP PANEL BY RT-PCR (FLU A&B, COVID) [id number]    EKG None  Radiology DG Chest 2 View  Result Date: 02/22/2021 CLINICAL DATA:  Cough, congestion EXAM: CHEST - 2 VIEW COMPARISON:   01/21/2018 FINDINGS: Prior CABG. Heart and mediastinal contours are within normal limits. No focal opacities or effusions. No acute bony abnormality. IMPRESSION: No active cardiopulmonary disease. Electronically Signed   By: Charlett Nose M.D.   On: 02/22/2021 13:58    Procedures Procedures   Medications Ordered in ED Medications - No data to display  ED Course  I have reviewed the triage vital signs and the nursing notes.  Pertinent labs & imaging results that were available during my care of the patient were reviewed by me and considered in my medical decision making (see chart for details).    MDM Rules/Calculators/A&P                         55 year old female with history of CAD and COPD, presenting for persistent cough and shortness of breath for the past 10 days.  COPD is undocumented, however, she does have 46-year smoking history and adult-onset reactive airway disease, relieved by breathing treatments.  Recent exacerbation of symptoms is following a recent flulike illness.  She has utilized albuterol, prednisone, and Phenergan DM without relief.  Previous prescription for prednisone was at 10 mg/day.  Given her history of COPD, she may benefit from higher dosing.  Patient's presentation is consistent with bronchitis.  However, given history of COPD, I do feel that treatment for COPD exacerbation would be appropriate in the setting of increased sputum production and persistent symptoms.  Patient to be prescribed 40 mg/day prednisone and doxycycline.  Patient was also counseled on cessation of smoking to help relieve symptoms.  She stable for discharge at this time.  Final Clinical Impression(s) / ED Diagnoses Final diagnoses:  Acute cough  Acute bronchitis, unspecified organism    Rx / DC Orders ED Discharge Orders          Ordered    albuterol (VENTOLIN HFA) 108 (90 Base) MCG/ACT inhaler  Every 4 hours PRN        02/22/21 1511    predniSONE (DELTASONE) 10 MG tablet  Daily         02/22/21 1511    doxycycline (VIBRAMYCIN) 100 MG capsule  2 times daily        02/22/21 1511    benzonatate (TESSALON) 100 MG capsule  3 times daily PRN        02/22/21 1511             Gloris Manchester, MD 02/22/21 1514

## 2021-06-02 DIAGNOSIS — I10 Essential (primary) hypertension: Secondary | ICD-10-CM | POA: Diagnosis not present

## 2021-06-02 DIAGNOSIS — R079 Chest pain, unspecified: Secondary | ICD-10-CM | POA: Diagnosis not present

## 2021-06-02 DIAGNOSIS — Z9889 Other specified postprocedural states: Secondary | ICD-10-CM | POA: Diagnosis not present

## 2021-06-02 DIAGNOSIS — I25700 Atherosclerosis of coronary artery bypass graft(s), unspecified, with unstable angina pectoris: Secondary | ICD-10-CM | POA: Diagnosis not present

## 2021-06-03 ENCOUNTER — Other Ambulatory Visit: Payer: Self-pay | Admitting: Cardiology

## 2021-06-03 ENCOUNTER — Other Ambulatory Visit (HOSPITAL_COMMUNITY): Payer: Self-pay | Admitting: Cardiology

## 2021-06-03 DIAGNOSIS — R0602 Shortness of breath: Secondary | ICD-10-CM

## 2021-06-03 DIAGNOSIS — R079 Chest pain, unspecified: Secondary | ICD-10-CM

## 2021-06-03 DIAGNOSIS — Z951 Presence of aortocoronary bypass graft: Secondary | ICD-10-CM

## 2021-06-03 DIAGNOSIS — I25700 Atherosclerosis of coronary artery bypass graft(s), unspecified, with unstable angina pectoris: Secondary | ICD-10-CM

## 2021-06-07 DIAGNOSIS — R69 Illness, unspecified: Secondary | ICD-10-CM | POA: Diagnosis not present

## 2021-06-07 DIAGNOSIS — Z01411 Encounter for gynecological examination (general) (routine) with abnormal findings: Secondary | ICD-10-CM | POA: Diagnosis not present

## 2021-06-07 DIAGNOSIS — Z90710 Acquired absence of both cervix and uterus: Secondary | ICD-10-CM | POA: Diagnosis not present

## 2021-06-07 DIAGNOSIS — Z6829 Body mass index (BMI) 29.0-29.9, adult: Secondary | ICD-10-CM | POA: Diagnosis not present

## 2021-06-07 DIAGNOSIS — Z124 Encounter for screening for malignant neoplasm of cervix: Secondary | ICD-10-CM | POA: Diagnosis not present

## 2021-06-07 DIAGNOSIS — Z1231 Encounter for screening mammogram for malignant neoplasm of breast: Secondary | ICD-10-CM | POA: Diagnosis not present

## 2021-06-07 DIAGNOSIS — A630 Anogenital (venereal) warts: Secondary | ICD-10-CM | POA: Diagnosis not present

## 2021-06-07 DIAGNOSIS — K649 Unspecified hemorrhoids: Secondary | ICD-10-CM | POA: Diagnosis not present

## 2021-06-07 DIAGNOSIS — Z113 Encounter for screening for infections with a predominantly sexual mode of transmission: Secondary | ICD-10-CM | POA: Diagnosis not present

## 2021-06-29 DIAGNOSIS — Z8709 Personal history of other diseases of the respiratory system: Secondary | ICD-10-CM | POA: Diagnosis not present

## 2021-06-29 DIAGNOSIS — Z87891 Personal history of nicotine dependence: Secondary | ICD-10-CM | POA: Diagnosis not present

## 2021-06-29 DIAGNOSIS — J40 Bronchitis, not specified as acute or chronic: Secondary | ICD-10-CM | POA: Diagnosis not present

## 2021-06-29 DIAGNOSIS — J069 Acute upper respiratory infection, unspecified: Secondary | ICD-10-CM | POA: Diagnosis not present

## 2021-10-07 DIAGNOSIS — R69 Illness, unspecified: Secondary | ICD-10-CM | POA: Diagnosis not present

## 2021-10-07 DIAGNOSIS — F5101 Primary insomnia: Secondary | ICD-10-CM | POA: Diagnosis not present

## 2022-01-06 DIAGNOSIS — R69 Illness, unspecified: Secondary | ICD-10-CM | POA: Diagnosis not present

## 2022-01-06 DIAGNOSIS — F5101 Primary insomnia: Secondary | ICD-10-CM | POA: Diagnosis not present

## 2022-01-25 DIAGNOSIS — R051 Acute cough: Secondary | ICD-10-CM | POA: Diagnosis not present

## 2022-01-25 DIAGNOSIS — J441 Chronic obstructive pulmonary disease with (acute) exacerbation: Secondary | ICD-10-CM | POA: Diagnosis not present

## 2022-02-04 DIAGNOSIS — R69 Illness, unspecified: Secondary | ICD-10-CM | POA: Diagnosis not present

## 2022-02-04 DIAGNOSIS — F5101 Primary insomnia: Secondary | ICD-10-CM | POA: Diagnosis not present

## 2022-03-03 DIAGNOSIS — M62838 Other muscle spasm: Secondary | ICD-10-CM | POA: Diagnosis not present

## 2022-06-22 DIAGNOSIS — I10 Essential (primary) hypertension: Secondary | ICD-10-CM | POA: Diagnosis not present

## 2022-06-22 DIAGNOSIS — I25700 Atherosclerosis of coronary artery bypass graft(s), unspecified, with unstable angina pectoris: Secondary | ICD-10-CM | POA: Diagnosis not present

## 2022-06-22 DIAGNOSIS — I2510 Atherosclerotic heart disease of native coronary artery without angina pectoris: Secondary | ICD-10-CM | POA: Diagnosis not present

## 2022-06-22 DIAGNOSIS — Z9889 Other specified postprocedural states: Secondary | ICD-10-CM | POA: Diagnosis not present

## 2022-06-22 DIAGNOSIS — Z951 Presence of aortocoronary bypass graft: Secondary | ICD-10-CM | POA: Diagnosis not present

## 2022-06-22 DIAGNOSIS — R0602 Shortness of breath: Secondary | ICD-10-CM | POA: Diagnosis not present

## 2022-11-27 DIAGNOSIS — M25549 Pain in joints of unspecified hand: Secondary | ICD-10-CM | POA: Diagnosis not present

## 2022-12-20 IMAGING — CR DG CHEST 2V
2 series · 2 of 2 positions shown · non-contrast
Comparison: 01/21/2018

CLINICAL DATA: Cough, congestion

EXAM:
CHEST - 2 VIEW

[w chest pa]
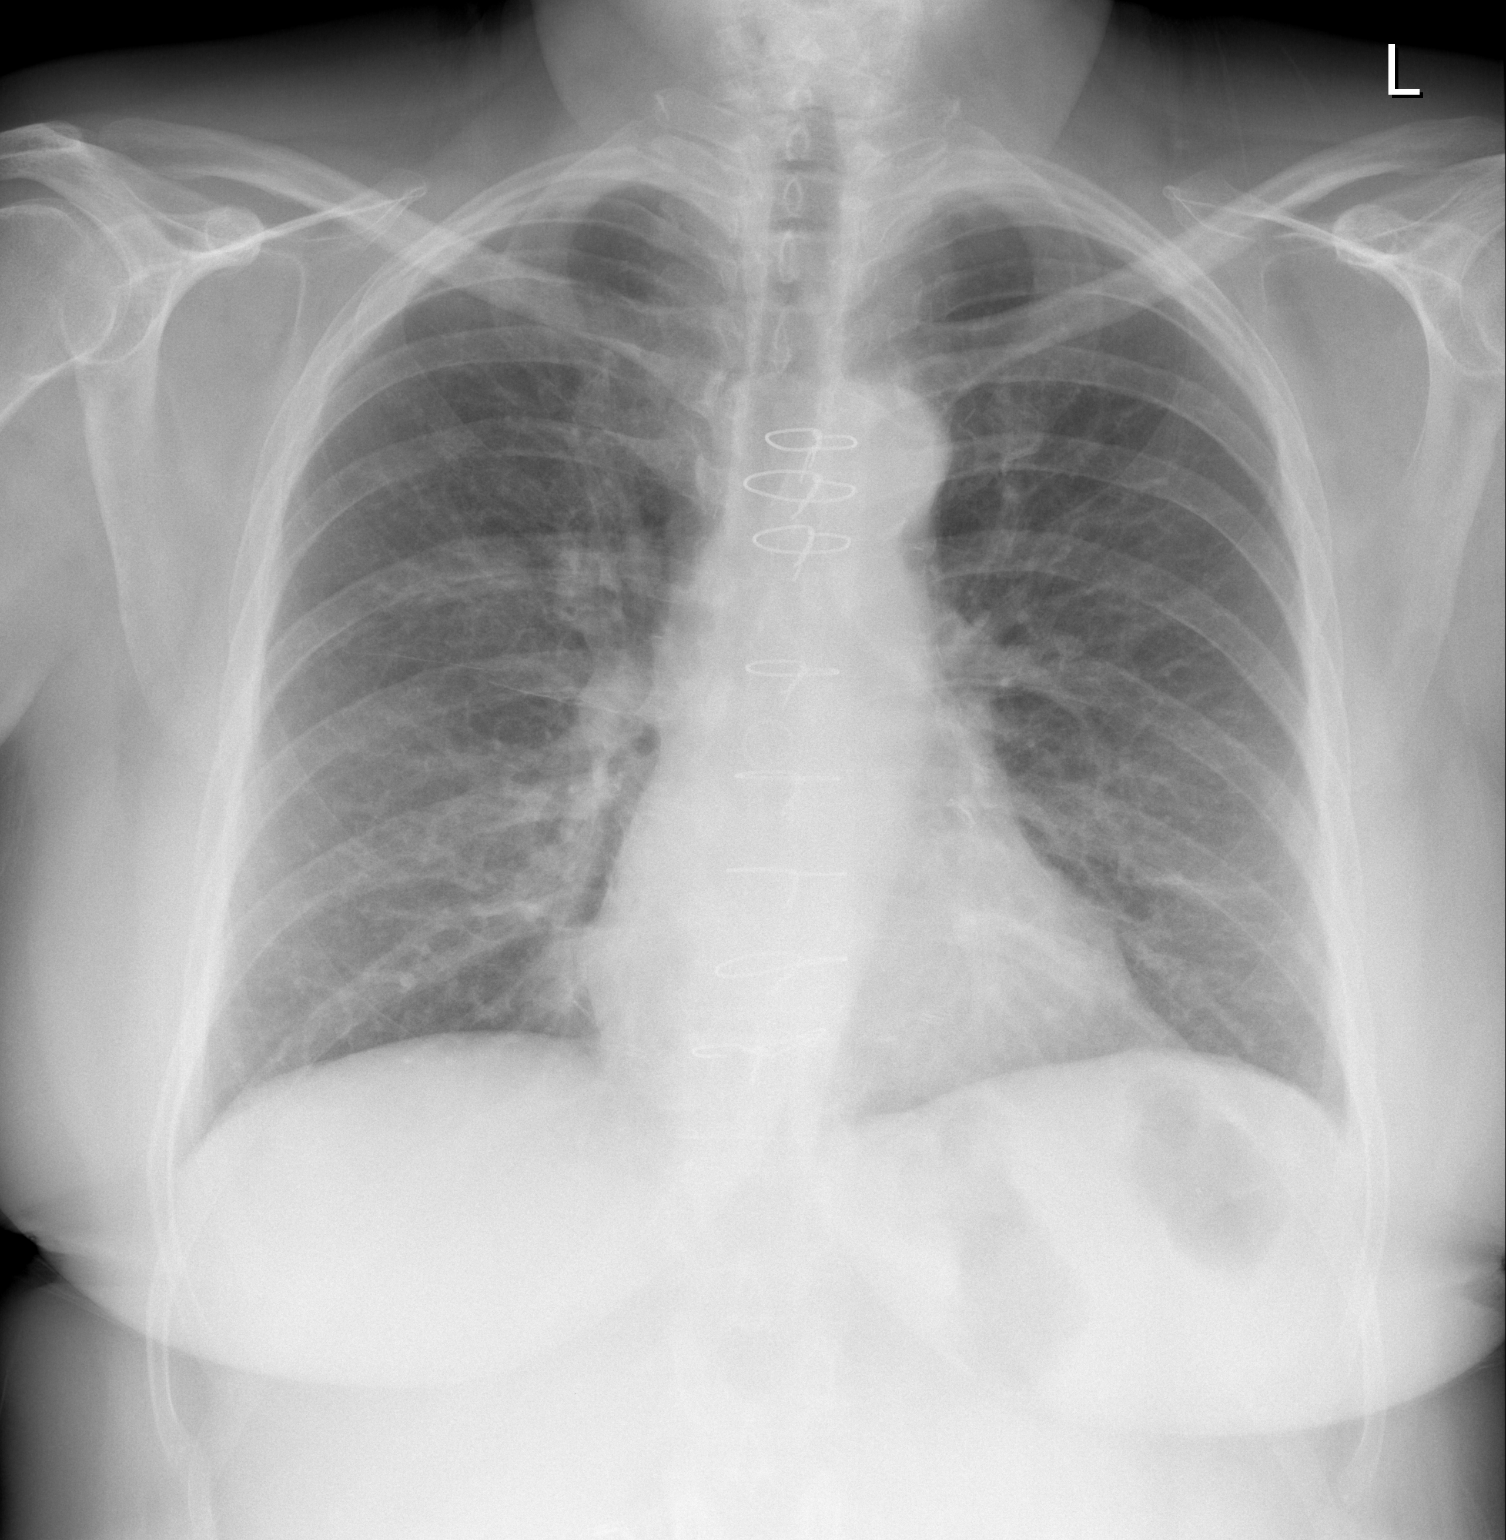

[w chest lat]
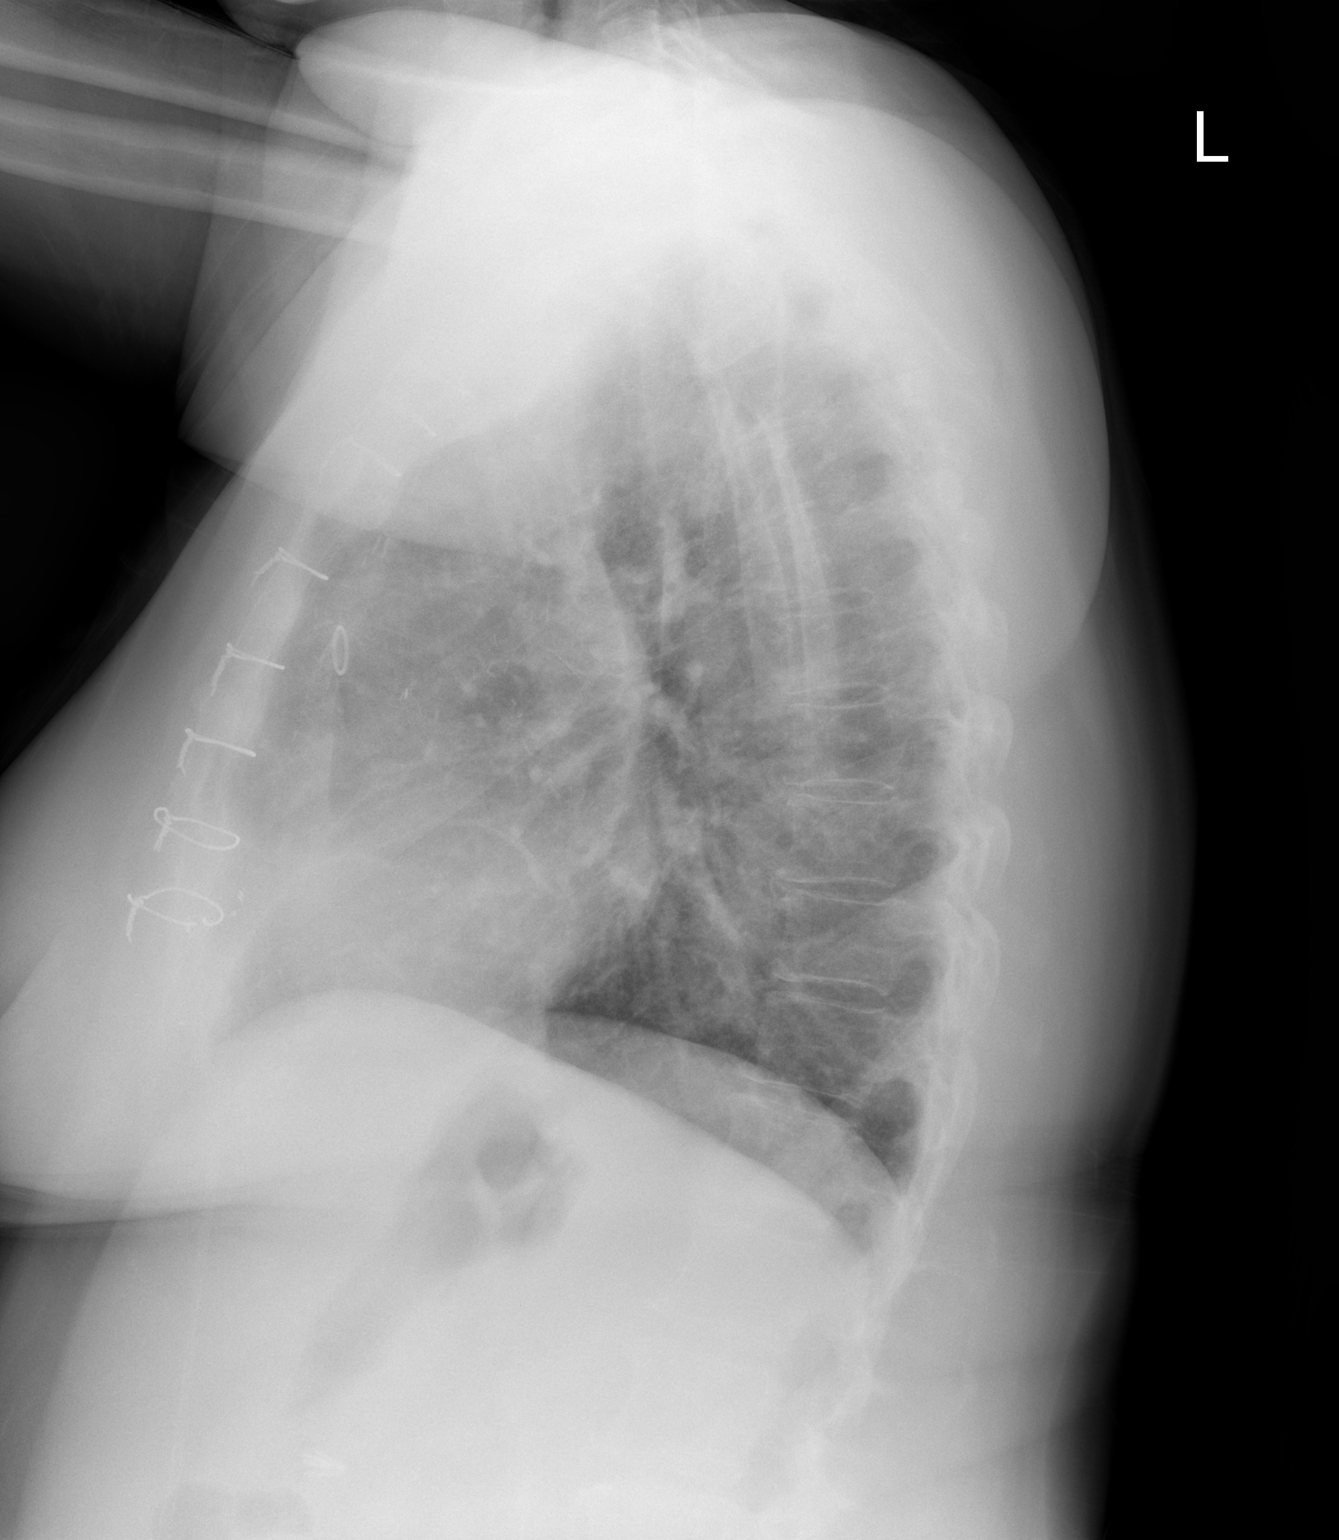

[2 of 2 positions shown; findings below may reference images not displayed]

FINDINGS: Prior CABG. Heart and mediastinal contours are within normal limits.
No focal opacities or effusions. No acute bony abnormality.
IMPRESSION: No active cardiopulmonary disease.

## 2023-01-06 DIAGNOSIS — F5101 Primary insomnia: Secondary | ICD-10-CM | POA: Diagnosis not present

## 2023-01-06 DIAGNOSIS — F32A Depression, unspecified: Secondary | ICD-10-CM | POA: Diagnosis not present

## 2023-02-01 DIAGNOSIS — N61 Mastitis without abscess: Secondary | ICD-10-CM | POA: Diagnosis not present

## 2023-02-02 DIAGNOSIS — H04123 Dry eye syndrome of bilateral lacrimal glands: Secondary | ICD-10-CM | POA: Diagnosis not present

## 2023-02-10 DIAGNOSIS — N61 Mastitis without abscess: Secondary | ICD-10-CM | POA: Diagnosis not present

## 2023-02-10 DIAGNOSIS — N611 Abscess of the breast and nipple: Secondary | ICD-10-CM | POA: Diagnosis not present

## 2023-02-13 DIAGNOSIS — M1811 Unilateral primary osteoarthritis of first carpometacarpal joint, right hand: Secondary | ICD-10-CM | POA: Diagnosis not present

## 2023-02-13 DIAGNOSIS — M65311 Trigger thumb, right thumb: Secondary | ICD-10-CM | POA: Diagnosis not present

## 2023-02-13 DIAGNOSIS — Z4789 Encounter for other orthopedic aftercare: Secondary | ICD-10-CM | POA: Diagnosis not present

## 2023-03-16 DIAGNOSIS — U071 COVID-19: Secondary | ICD-10-CM | POA: Diagnosis not present

## 2023-03-29 DIAGNOSIS — M65331 Trigger finger, right middle finger: Secondary | ICD-10-CM | POA: Diagnosis not present

## 2023-03-29 DIAGNOSIS — M65311 Trigger thumb, right thumb: Secondary | ICD-10-CM | POA: Diagnosis not present
